# Patient Record
Sex: Female | Born: 1995 | Hispanic: No | Marital: Single | Smoking: Former smoker
Health system: Southern US, Community
[De-identification: ages and names within clinical notes are randomized; demographics above are authoritative.]

## PROBLEM LIST (undated history)

## (undated) ENCOUNTER — Inpatient Hospital Stay (HOSPITAL_COMMUNITY): Payer: Self-pay

## (undated) DIAGNOSIS — F329 Major depressive disorder, single episode, unspecified: Secondary | ICD-10-CM

## (undated) DIAGNOSIS — R51 Headache: Secondary | ICD-10-CM

## (undated) DIAGNOSIS — F32A Depression, unspecified: Secondary | ICD-10-CM

## (undated) DIAGNOSIS — F419 Anxiety disorder, unspecified: Secondary | ICD-10-CM

## (undated) HISTORY — PX: NO PAST SURGERIES: SHX2092

## (undated) HISTORY — DX: Headache: R51

---
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
## 2017-11-13 ENCOUNTER — Other Ambulatory Visit: Payer: Self-pay

## 2017-11-13 ENCOUNTER — Inpatient Hospital Stay (HOSPITAL_COMMUNITY)
Admission: AD | Admit: 2017-11-13 | Discharge: 2017-11-13 | Disposition: A | Discharge status: Home / Self Care | Payer: Self-pay | Source: Ambulatory Visit | Attending: Obstetrics and Gynecology | Admitting: Obstetrics and Gynecology

## 2017-11-13 ENCOUNTER — Encounter (HOSPITAL_COMMUNITY): Payer: Self-pay | Admitting: *Deleted

## 2017-11-13 DIAGNOSIS — N764 Abscess of vulva: Secondary | ICD-10-CM

## 2017-11-13 LAB — URINALYSIS, ROUTINE W REFLEX MICROSCOPIC
BILIRUBIN URINE: NEGATIVE
Glucose, UA: NEGATIVE mg/dL
Hgb urine dipstick: NEGATIVE
KETONES UR: NEGATIVE mg/dL
LEUKOCYTES UA: NEGATIVE
NITRITE: NEGATIVE
PROTEIN: NEGATIVE mg/dL
Specific Gravity, Urine: 1.017 (ref 1.005–1.030)
pH: 7 (ref 5.0–8.0)

## 2017-11-13 LAB — POCT PREGNANCY, URINE: Preg Test, Ur: NEGATIVE

## 2017-11-13 MED ORDER — IBUPROFEN 600 MG PO TABS: 600.0000 mg | ORAL_TABLET | Freq: Four times a day (QID) | ORAL | 0 refills | Status: DC | PRN

## 2017-11-13 MED ORDER — AMOXICILLIN-POT CLAVULANATE 875-125 MG PO TABS: 1.0000 | ORAL_TABLET | Freq: Two times a day (BID) | ORAL | 0 refills | Status: DC

## 2017-11-13 NOTE — MAU Provider Note (Signed)
History     CSN: [id number]  Arrival date and time: 11/13/17 1807   First Provider Initiated Contact with Patient 11/13/17 1842      Chief Complaint  Patient presents with  . Possible Boil   HPI Haley Potter 21 y.o.  Comes to MAU with boil in the genital area.  A week ago it was pea sized and nontender.  Several days ago she soaked it and it became much larger.  [facility] and they said to continue to soak it.  This morning she awakened and it was much larger again and very tender.  Has not squeezed the area.  OB History    No data available      Past Medical History:  Diagnosis Date  . Headache(784.0)     History reviewed. No pertinent surgical history.  Family History  Problem Relation Age of Onset  . Heart attack Maternal Grandfather        Died at the age of 21    Social History   Tobacco Use  . Smoking status: Never Smoker  . Smokeless tobacco: Never Used  Substance Use Topics  . Alcohol use: No  . Drug use: Yes    Types: Marijuana    Allergies: No Known Allergies  Medications Prior to Admission  Medication Sig Dispense Refill Last Dose  . ibuprofen (ADVIL,MOTRIN) 200 MG tablet Take 400 mg by mouth every 6 (six) hours as needed for pain.   Past Week at Unknown  . loratadine (CLARITIN) 10 MG tablet Take 10 mg by mouth as needed for allergies.   two months  . ondansetron (ZOFRAN ODT) 4 MG disintegrating tablet Take 1 tablet (4 mg total) by mouth every 8 (eight) hours as needed for nausea. 8 tablet 0   . topiramate (TOPAMAX) 25 MG tablet Take 2 by mouth each bedtime 60 tablet 0     Review of Systems  Constitutional: Negative for fever.  Gastrointestinal: Negative for abdominal pain.  Genitourinary: Negative for vaginal bleeding and vaginal discharge.       Genital lesion that is painful   Physical Exam   Blood pressure (!) 155/71, pulse (!) 159, temperature 98.5 F (36.9 C), temperature source Oral, height 5'  2" (1.575 m), weight 141 lb 12 oz (64.3 kg), last menstrual period 10/21/2017, SpO2 99 %.  Physical Exam  Nursing note and vitals reviewed. Constitutional: She is oriented to person, place, and time. She appears well-developed and well-nourished.  HENT:  Head: Normocephalic.  Eyes: EOM are normal.  Neck: Neck supple.  Respiratory: Effort normal.  GI: Soft. There is no tenderness. There is no rebound and no guarding.  Genitourinary:  Genitourinary Comments: Genital exam - Swollen left labia - does not include clitoral hood.  Swollen lesion is 1.5 cm x 2cm of cellulitis and then lower is 2-3 cm of edema.  Total of 4-5 cm by 2 cm with a large portion of soft edema.  The cellulitis area is red with no pointing even with slight pressure.  It is firm and not fluctuant.  Musculoskeletal: Normal range of motion.  Neurological: She is alert and oriented to person, place, and time.  Skin: Skin is warm and dry.  Psychiatric: She has a normal mood and affect.    MAU Course  Procedures Results for orders placed or performed during the hospital encounter of 11/13/17 (from the past 24 hour(s))  Urinalysis, Routine w reflex microscopic     Status:  Abnormal   Collection Time: 11/13/17  6:25 PM  Result Value Ref Range   Color, Urine YELLOW YELLOW   APPearance HAZY (A) CLEAR   Specific Gravity, Urine 1.017 1.005 - 1.030   pH 7.0 5.0 - 8.0   Glucose, UA NEGATIVE NEGATIVE mg/dL   Hgb urine dipstick NEGATIVE NEGATIVE   Bilirubin Urine NEGATIVE NEGATIVE   Ketones, ur NEGATIVE NEGATIVE mg/dL   Protein, ur NEGATIVE NEGATIVE mg/dL   Nitrite NEGATIVE NEGATIVE   Leukocytes, UA NEGATIVE NEGATIVE  Pregnancy, urine POC     Status: None   Collection Time: 11/13/17  6:37 PM  Result Value Ref Range   Preg Test, Ur NEGATIVE NEGATIVE    MDM Dr. Alysia PennaErvin in to evaluate the area and discuss plan of care.  Assessment and Plan  Labial cyst, infected  Plan Cyst is not ready for draining at this point GC/Chlam  pending on urine specimen. Antibiotics sent to her pharmacy.  Augmentin 875 mg PO BID x 10 days.  If not tolerated, next option would be keflex 500 mg PO TID x 10 days Continue warm soaks with epsom salts at least 3 times a day. Be seen in the clinic on Monday at 1:40 pm at urgent appointment for reevaluation of cyst.  Sticker put in clinic book in MAU.   [name] 11/13/2017, 7:08 PM   Pt seen and examined. Cyst no amendable for I & D yet. Will treat with antibiotic and continue with warm compresses. F/U in GYN clinic first of next week for reevaluation.  Nettie ElmMichael Emett Stapel, MD

## 2017-11-13 NOTE — Discharge Instructions (Signed)
Cyst is not ready for draining at this point Antibiotics sent to your pharmacy.  Augmentin 875 mg PO BID x 10 days.   Continue warm soaks with epsom salts at least 3 times a day.  Be seen in the clinic on Monday at 1:40 pm at urgent appointment for reevaluation of cyst.

## 2017-11-13 NOTE — MAU Note (Signed)
Pt presents with c/o "cyst" on inner left labia.  Pt reports started off pea size approx 1 week ago, but has progressively gotten bigger and causing more discomfort.  Pt states she's applied heat & ice & taken warm baths, no relief noted.  Reports was seen yesterday @ [facility] and was instructed to be seen in MAU.

## 2017-11-16 ENCOUNTER — Inpatient Hospital Stay (HOSPITAL_COMMUNITY)
Admission: AD | Admit: 2017-11-16 | Discharge: 2017-11-16 | Disposition: A | Discharge status: Home / Self Care | Payer: Self-pay | Source: Ambulatory Visit | Attending: Obstetrics & Gynecology | Admitting: Obstetrics & Gynecology

## 2017-11-16 ENCOUNTER — Ambulatory Visit: Payer: Self-pay | Admitting: Obstetrics and Gynecology

## 2017-11-16 ENCOUNTER — Encounter (HOSPITAL_COMMUNITY): Payer: Self-pay | Admitting: *Deleted

## 2017-11-16 DIAGNOSIS — N907 Vulvar cyst: Secondary | ICD-10-CM

## 2017-11-16 DIAGNOSIS — R102 Pelvic and perineal pain: Secondary | ICD-10-CM | POA: Insufficient documentation

## 2017-11-16 HISTORY — DX: Major depressive disorder, single episode, unspecified: F32.9

## 2017-11-16 HISTORY — DX: Depression, unspecified: F32.A

## 2017-11-16 HISTORY — DX: Anxiety disorder, unspecified: F41.9

## 2017-11-16 MED ORDER — LIDOCAINE HCL 2 % EX GEL
1.0000 "application " | Freq: Once | CUTANEOUS | Status: AC
  Administered 2017-11-16: 05:00:00 via TOPICAL
  Filled 2017-11-16: qty 5

## 2017-11-16 MED ORDER — OXYCODONE-ACETAMINOPHEN 5-325 MG PO TABS
2.0000 | ORAL_TABLET | Freq: Once | ORAL | Status: AC
  Administered 2017-11-16: 2 via ORAL
  Filled 2017-11-16: qty 2

## 2017-11-16 MED ORDER — SULFAMETHOXAZOLE-TRIMETHOPRIM 800-160 MG PO TABS: 1.0000 | ORAL_TABLET | Freq: Two times a day (BID) | ORAL | 0 refills | Status: AC

## 2017-11-16 NOTE — MAU Provider Note (Signed)
Chief Complaint: Cyst   First Provider Initiated Contact with Patient 11/16/17 0331      SUBJECTIVE HPI: Haley Potter is a 21 y.o. G0P0000 who presents to maternity admissions reporting labial cyst is larger, more painful, and is turning black. The cyst started 1 week ago, and she was seen in MAU on 11/13/17. The cyst was too small to drain so she was prescribed abx, which she started yesterday.  Today the cyst became more painful, and she noticed it was much larger, making her whole left labia look swollen. There is also a black dot on top of the cyst which is new today.  She has taken ibuprofen and tried warm compresses but the pain is increasing. She denies any associated symptoms. She denies vaginal bleeding, vaginal itching/burning, urinary symptoms, h/a, dizziness, n/v, or fever/chills.     HPI  Past Medical History:  Diagnosis Date  . Anxiety   . Depression   . ZOXWRUEA(540.9)    Past Surgical History:  Procedure Laterality Date  . NO PAST SURGERIES     Social History   Socioeconomic History  . Marital status: Single    Spouse name: Not on file  . Number of children: Not on file  . Years of education: Not on file  . Highest education level: Not on file  Social Needs  . Financial resource strain: Not on file  . Food insecurity - worry: Not on file  . Food insecurity - inability: Not on file  . Transportation needs - medical: Not on file  . Transportation needs - non-medical: Not on file  Occupational History  . Not on file  Tobacco Use  . Smoking status: Never Smoker  . Smokeless tobacco: Never Used  Substance and Sexual Activity  . Alcohol use: No  . Drug use: Yes    Types: Marijuana    Comment: last use 13 Nov 2017  . Sexual activity: Yes    Birth control/protection: Condom  Other Topics Concern  . Not on file  Social History Narrative  . Not on file   No current facility-administered medications on file prior to encounter.    Current Outpatient  Medications on File Prior to Encounter  Medication Sig Dispense Refill  . amoxicillin-clavulanate (AUGMENTIN) 875-125 MG tablet Take 1 tablet by mouth 2 (two) times daily for 10 days. 20 tablet 0  . naproxen sodium (ALEVE) 220 MG tablet Take 220 mg by mouth.    Marland Kitchen ibuprofen (ADVIL,MOTRIN) 600 MG tablet Take 1 tablet (600 mg total) by mouth every 6 (six) hours as needed. Take with food. 30 tablet 0  . loratadine (CLARITIN) 10 MG tablet Take 10 mg by mouth as needed for allergies.     No Known Allergies  ROS:  Review of Systems  Constitutional: Negative for chills, fatigue and fever.  Respiratory: Negative for shortness of breath.   Cardiovascular: Negative for chest pain.  Gastrointestinal: Negative for nausea and vomiting.  Genitourinary: Positive for vaginal pain. Negative for difficulty urinating, dysuria, flank pain, pelvic pain, vaginal bleeding and vaginal discharge.  Neurological: Negative for dizziness and headaches.  Psychiatric/Behavioral: Negative.      I have reviewed patient's Past Medical Hx, Surgical Hx, Family Hx, Social Hx, medications and allergies.   Physical Exam   Patient Vitals for the past 24 hrs:  BP Temp Temp src Pulse Resp Height Weight  11/16/17 0509 128/71 - - 89 18 - -  11/16/17 0334 135/82 - - (!) 114 - - -  11/16/17 0327 130/90 98.5 F (36.9 C) Oral (!) 130 18 5\' 2"  (1.575 m) 141 lb (64 kg)   Constitutional: Well-developed, well-nourished female in no acute distress.  Cardiovascular: normal rate Respiratory: normal effort GI: Abd soft, non-tender. Pos BS x 4 MS: Extremities nontender, no edema, normal ROM Neurologic: Alert and oriented x 4.  GU: Neg CVAT.  PELVIC EXAM: On visual inspection, left labia with 3x3cm soft, fluctuant cyst, with significant tenderness to palpation, with 1 x 1 cm patch of purplish-black tissue in the middle of the enlarged area. There is also leaking from the outside portion of the cyst of thin purulent drainage.  After  topical lidocaine was applied by RN, pt reported increased thick drainage from cyst.  Thick, purulent drainage from upper/outer part of left labia.  Labia Cyst Drainage Enlarged abscess palpated in left labia. Written informed consent was obtained.  Discussed complications and possible outcomes of procedure including recurrence of cyst, scarring leading to infecton, bleeding, dyspareunia, distortion of anatomy.  Patient was examined in the dorsal lithotomy position and mass was identified.  The area was prepped with Iodine and draped in a sterile manner. 1% Lidocaine (3 ml) was then used to infiltrate area on top of the cyst.  A 7 mm incision was made using a sterile scapel. Upon palpation of the mass, a moderate amount of bloody drainage was expressed through the incision. A hemostat was used to break up loculations, which resulted in expression of mostly bloody drainage.  More purulent drainage expressed from left labial area that is self draining. Patient tolerated the procedure well, reported feeling " a lot better." - Bactrim DS bid x 7 days for treatment - Recommended Sitz baths bid and Motrin prn pain.   She was told to call to be examined if she experiences increasing swelling, pain, vaginal discharge, or fever.  - She was instructed to wear a peripad to absorb discharge, and to maintain pelvic rest until area is healed.  - F/u appointment in 2 weeks from now.   LAB RESULTS No results found for this or any previous visit (from the past 24 hour(s)).     IMAGING No results found.  MAU Management/MDM: Some self-drainage of cyst plus I&D to aid drainage.  Consult Dr Macon LargeAnyanwu who came to bedside after I&D to evaluated discoloration/black spot. Likely varicosity and does not need treatment.  Pt reports significant improvement in pain after I&D.  Change abx to Bactrim DS BID x 7 days, sent to pt pharmacy.  [facility] in 2-3 weeks.  Return to MAU as needed for worsening symptoms.   Pt  discharged with strict infection precautions.  ASSESSMENT 1. Vulvar cyst     PLAN Discharge home Allergies as of 11/16/2017   No Known Allergies     Medication List    STOP taking these medications   amoxicillin-clavulanate 875-125 MG tablet Commonly known as:  AUGMENTIN   ibuprofen 600 MG tablet Commonly known as:  ADVIL,MOTRIN     TAKE these medications   loratadine 10 MG tablet Commonly known as:  CLARITIN Take 10 mg by mouth as needed for allergies.   naproxen sodium 220 MG tablet Commonly known as:  ALEVE Take 220 mg by mouth.   sulfamethoxazole-trimethoprim 800-160 MG tablet Commonly known as:  BACTRIM DS,SEPTRA DS Take 1 tablet by mouth 2 (two) times daily for 7 days.      Follow-up Information    [facility]  Specialty:  Obstetrics and Gynecology Why:  The office will call you with follow up appointment. Return to MAU as needed for worsening symptoms or signs of infection. Contact information: [address] [place] [id number] [phone]          Sharen CounterLisa Leftwich-Kirby Certified Nurse-Midwife 11/16/2017  5:26 AM

## 2017-11-16 NOTE — MAU Note (Signed)
Pt c/o labial cyst for 1 1/2 wks that is getting bigger and turning black. Took naprosyn and just started amoxicillin on Dec 2nd.  Afebrile.

## 2018-05-03 LAB — OB RESULTS CONSOLE GC/CHLAMYDIA
CHLAMYDIA, DNA PROBE: NEGATIVE
Chlamydia: NEGATIVE
GC PROBE AMP, GENITAL: NEGATIVE
Gonorrhea: NEGATIVE

## 2018-05-03 LAB — OB RESULTS CONSOLE HIV ANTIBODY (ROUTINE TESTING)
HIV: NONREACTIVE
HIV: NONREACTIVE

## 2018-05-03 LAB — OB RESULTS CONSOLE RUBELLA ANTIBODY, IGM
RUBELLA: IMMUNE
Rubella: IMMUNE

## 2018-05-03 LAB — OB RESULTS CONSOLE ANTIBODY SCREEN
Antibody Screen: NEGATIVE
Antibody Screen: NEGATIVE

## 2018-05-03 LAB — OB RESULTS CONSOLE ABO/RH
RH Type: POSITIVE
RH Type: POSITIVE

## 2018-05-03 LAB — OB RESULTS CONSOLE HEPATITIS B SURFACE ANTIGEN
HEP B S AG: NEGATIVE
Hepatitis B Surface Ag: NEGATIVE

## 2018-05-03 LAB — OB RESULTS CONSOLE RPR
RPR: NONREACTIVE
RPR: NONREACTIVE

## 2018-05-12 ENCOUNTER — Encounter: Payer: Self-pay | Admitting: Obstetrics

## 2018-05-20 ENCOUNTER — Telehealth: Payer: Self-pay | Admitting: *Deleted

## 2018-05-20 NOTE — Telephone Encounter (Signed)
Left vmail for patient to callback and reschedule missed New Ob appt on 05/12/2018 or let us know her plans.Marland Kitchen.Marland Kitchen..Marland Kitchen

## 2018-06-28 DIAGNOSIS — Z3403 Encounter for supervision of normal first pregnancy, third trimester: Secondary | ICD-10-CM | POA: Diagnosis not present

## 2018-06-28 LAB — OB RESULTS CONSOLE GBS: GBS: NEGATIVE

## 2018-07-02 ENCOUNTER — Encounter (HOSPITAL_COMMUNITY): Payer: Self-pay | Admitting: *Deleted

## 2018-07-02 ENCOUNTER — Inpatient Hospital Stay (HOSPITAL_BASED_OUTPATIENT_CLINIC_OR_DEPARTMENT_OTHER): Payer: Medicaid Other

## 2018-07-02 ENCOUNTER — Inpatient Hospital Stay (HOSPITAL_COMMUNITY)
Admission: AD | Admit: 2018-07-02 | Discharge: 2018-07-02 | Disposition: A | Discharge status: Home / Self Care | Payer: Medicaid Other | Source: Ambulatory Visit | Attending: Obstetrics & Gynecology | Admitting: Obstetrics & Gynecology

## 2018-07-02 DIAGNOSIS — O289 Unspecified abnormal findings on antenatal screening of mother: Secondary | ICD-10-CM

## 2018-07-02 DIAGNOSIS — O368130 Decreased fetal movements, third trimester, not applicable or unspecified: Secondary | ICD-10-CM

## 2018-07-02 DIAGNOSIS — Z3A36 36 weeks gestation of pregnancy: Secondary | ICD-10-CM

## 2018-07-02 DIAGNOSIS — R102 Pelvic and perineal pain: Secondary | ICD-10-CM | POA: Insufficient documentation

## 2018-07-02 DIAGNOSIS — O288 Other abnormal findings on antenatal screening of mother: Secondary | ICD-10-CM

## 2018-07-02 DIAGNOSIS — M549 Dorsalgia, unspecified: Secondary | ICD-10-CM | POA: Diagnosis not present

## 2018-07-02 DIAGNOSIS — Z87891 Personal history of nicotine dependence: Secondary | ICD-10-CM | POA: Insufficient documentation

## 2018-07-02 DIAGNOSIS — N898 Other specified noninflammatory disorders of vagina: Secondary | ICD-10-CM | POA: Insufficient documentation

## 2018-07-02 DIAGNOSIS — R879 Unspecified abnormal finding in specimens from female genital organs: Secondary | ICD-10-CM | POA: Diagnosis present

## 2018-07-02 DIAGNOSIS — O26893 Other specified pregnancy related conditions, third trimester: Secondary | ICD-10-CM | POA: Insufficient documentation

## 2018-07-02 LAB — AMNISURE RUPTURE OF MEMBRANE (ROM) NOT AT ARMC: Amnisure ROM: NEGATIVE

## 2018-07-02 NOTE — MAU Note (Signed)
Pt C/O back pain since Monday, became severe last night, radiates throughout her body, arms & legs tingle & go numb.  Drastic increase in vaginal discharge, ? Leaking fluid.  Feeling unterus tighten.  Denies bleeding.  Fetal movement decreased.

## 2018-07-02 NOTE — MAU Note (Signed)
Urine in lab 

## 2018-07-02 NOTE — MAU Provider Note (Signed)
History     CSN: [id number]  Arrival date and time: 07/02/18 4098   First Provider Initiated Contact with Patient 07/02/18 [phone]      Chief Complaint  Patient presents with  . Back Pain  . Rupture of Membranes  . Decreased Fetal Movement   HPI  Ms.  Haley Potter is a 22 y.o. year old G16P0000 female at [redacted]w[redacted]d weeks gestation who presents to MAU reporting back pain since Monday 06/28/18 - became severe last night, radiating throughout her body, arms and legs, numbness in legs and arms, "drastic" increase in vaginal discharge, leaking fluid, abdominal pain/"tightening", and DFM. She denies VB. She receives [facility]   Past Medical History:  Diagnosis Date  . Anxiety   . Depression   . YNWGNFAO(130.8)     Past Surgical History:  Procedure Laterality Date  . NO PAST SURGERIES      Family History  Problem Relation Age of Onset  . Heart attack Maternal Grandfather        Died at the age of 23    Social History   Tobacco Use  . Smoking status: Former Games developer  . Smokeless tobacco: Never Used  Substance Use Topics  . Alcohol use: No  . Drug use: Not Currently    Types: Marijuana    Comment: last use 13 Nov 2017    Allergies: No Known Allergies  Medications Prior to Admission  Medication Sig Dispense Refill Last Dose  . acetaminophen (TYLENOL) 500 MG tablet Take 500 mg by mouth every 6 (six) hours as needed for mild pain or headache.   07/01/2018 at Unknown time  . Prenatal Vit-Fe Fumarate-FA (PRENATAL MULTIVITAMIN) TABS tablet Take 1 tablet by mouth daily at 12 noon.   07/01/2018 at Unknown time    Review of Systems  Constitutional: Negative.   HENT: Negative.   Eyes: Negative.   Respiratory: Negative.   Gastrointestinal: Positive for abdominal pain.  Endocrine: Negative.   Genitourinary: Positive for pelvic pain and vaginal discharge ("drastic increase"; also ?leaking fluid since last night).  Musculoskeletal: Positive for back pain.  Skin: Negative.    Allergic/Immunologic: Negative.   Neurological: Positive for weakness (in legs and arms).  Hematological: Negative.   Psychiatric/Behavioral: Negative.    Physical Exam   Blood pressure 111/65, pulse (!) 121, temperature 98.8 F (37.1 C), temperature source Oral, resp. rate 16, height 5' 2.99" (1.6 m), weight 79.6 kg (175 lb 6.4 oz), last menstrual period 10/21/2017, SpO2 99 %.  Physical Exam  Nursing note and vitals reviewed. Constitutional: She is oriented to person, place, and time. She appears well-developed and well-nourished.  HENT:  Head: Normocephalic and atraumatic.  Eyes: Pupils are equal, round, and reactive to light.  Neck: Normal range of motion.  Cardiovascular: Normal rate, regular rhythm, normal heart sounds and intact distal pulses.  Respiratory: Effort normal and breath sounds normal.  GI: Soft. Bowel sounds are normal.  Genitourinary:  Genitourinary Comments: Pelvic deferred  Musculoskeletal: Normal range of motion.  Neurological: She is alert and oriented to person, place, and time. She has normal reflexes.  Skin: Skin is warm and dry.  Psychiatric: She has a normal mood and affect. Her behavior is normal. Judgment and thought content normal.    MAU Course  Procedures  MDM Fern Slide Amnisure NST - FHR: 150 bpm / minimal variability / 10x10 accels present / variable decels present / TOCO: regular every 2-4 mins   *Consult with Dr. Debroah Loop @ 1300 - notified  of patient's complaints, assessments, lab & U/S results, tx plan d/c home - ok to d/c home, agrees with plan; Results for orders placed or performed during the hospital encounter of 07/02/18 (from the past 24 hour(s))  Amnisure rupture of membrane (rom)not at [facility]     Status: None   Collection Time: 07/02/18  9:54 AM  Result Value Ref Range   Amnisure ROM NEGATIVE     [place] Mfm Fetal Bpp Wo Non Stress  Result Date: 07/02/2018 ----------------------------------------------------------------------   OBSTETRICS REPORT                      (Signed Final 07/02/2018 11:45 am) ---------------------------------------------------------------------- Patient Info  ID #:       [id number]                          D.O.B.:  Jan 04, 1996 (22 yrs)  Name:       Haley Potter                Visit Date: 07/02/2018 11:07 am ---------------------------------------------------------------------- Performed By  Performed By:     Eden Lathe BS      Ref. Address:     [address]                    [facility]                                                             [address]                                                             [place], [place]                                                             [address]  Attending:        Blase Mess MD       Location[facility]         [facility]  Referred By:      Haley Mora CNM ---------------------------------------------------------------------- Orders   #  Description                                 Code   1  [place] MFM FETAL BPP WO NON STRESS              [phone]  ----------------------------------------------------------------------   #  Ordered By               Order #        Accession #    Episode #   1  Haley Mora           [id number]  [id number]     [id number]  ---------------------------------------------------------------------- Indications   [redacted] weeks gestation of pregnancy                Z3A.36   Non-reactive NST                               O28.9  ---------------------------------------------------------------------- OB History  Gravidity:    1         Term:   0        Prem:   0        SAB:   0  TOP:          0       Ectopic:  0        Living: 0 ---------------------------------------------------------------------- Fetal Evaluation  Num Of Fetuses:     1  Fetal Heart         153  Rate(bpm):  Cardiac Activity:   Observed  Presentation:       Cephalic  Amniotic Fluid  AFI FV:      Subjectively within normal limits  AFI Sum(cm)     %Tile       Largest  Pocket(cm)  15.47           57          6.09  RUQ(cm)       RLQ(cm)       LUQ(cm)        LLQ(cm)  4.14          1.57          6.09           3.67 ---------------------------------------------------------------------- Biophysical Evaluation  Amniotic F.V:   Within normal limits       F. Tone:        Observed  F. Movement:    Observed                   Score:          8/8  F. Breathing:   Observed ---------------------------------------------------------------------- Gestational Age  LMP:           36w 2d        Date:  10/21/17                 EDD:   07/28/18  Best:          Stevie Kern36w 2d     Det. By:  LMP  (10/21/17)          EDD:   07/28/18 ---------------------------------------------------------------------- Recommendations  Follow up as clinically indicated and or scheduled. ----------------------------------------------------------------------                 Blase MessHenry Adekola, MD Electronically Signed Final Report   07/02/2018 11:45 am ----------------------------------------------------------------------   Assessment and Plan  Abnormal vaginal fluids - Plan: Discharge patient - Reassurance that Amnisure was negative; meaning that her water is not broken  Non-reactive NST (non-stress test) - Reassurance given that U/S & NST (after U/S) confirmed good fetal well-being - Advised that NST eventually became reactive   Haley Moraolitta Malaia Buchta, MSN, CNM 07/02/2018, 9:50 AM

## 2018-07-02 NOTE — Discharge Instructions (Signed)
You can take Tylenol 1000 mg every 6 hours as needed for back pain

## 2018-08-02 ENCOUNTER — Telehealth (HOSPITAL_COMMUNITY): Payer: Self-pay | Admitting: *Deleted

## 2018-08-02 DIAGNOSIS — Z3403 Encounter for supervision of normal first pregnancy, third trimester: Secondary | ICD-10-CM | POA: Diagnosis not present

## 2018-08-02 DIAGNOSIS — O365930 Maternal care for other known or suspected poor fetal growth, third trimester, not applicable or unspecified: Secondary | ICD-10-CM | POA: Diagnosis not present

## 2018-08-02 DIAGNOSIS — O480 Post-term pregnancy: Secondary | ICD-10-CM | POA: Diagnosis not present

## 2018-08-02 NOTE — Telephone Encounter (Signed)
Preadmission screen  

## 2018-08-03 ENCOUNTER — Encounter (HOSPITAL_COMMUNITY): Payer: Self-pay | Admitting: *Deleted

## 2018-08-03 ENCOUNTER — Telehealth (HOSPITAL_COMMUNITY): Payer: Self-pay | Admitting: *Deleted

## 2018-08-03 NOTE — Telephone Encounter (Signed)
Preadmission screen  

## 2018-08-04 ENCOUNTER — Encounter (HOSPITAL_COMMUNITY): Payer: Self-pay

## 2018-08-04 ENCOUNTER — Other Ambulatory Visit: Payer: Self-pay

## 2018-08-04 ENCOUNTER — Inpatient Hospital Stay (HOSPITAL_COMMUNITY): Payer: Medicaid Other | Admitting: Anesthesiology

## 2018-08-04 ENCOUNTER — Inpatient Hospital Stay (HOSPITAL_COMMUNITY)
Admission: RE | Admit: 2018-08-04 | Discharge: 2018-08-08 | DRG: 786 | Disposition: A | Discharge status: Home / Self Care | Payer: Medicaid Other | Attending: Obstetrics and Gynecology | Admitting: Obstetrics and Gynecology

## 2018-08-04 VITALS — BP 119/74 | HR 71 | Temp 98.8°F | Resp 18 | Ht 62.0 in | Wt 190.2 lb

## 2018-08-04 DIAGNOSIS — D649 Anemia, unspecified: Secondary | ICD-10-CM | POA: Diagnosis present

## 2018-08-04 DIAGNOSIS — Z3A41 41 weeks gestation of pregnancy: Secondary | ICD-10-CM | POA: Diagnosis not present

## 2018-08-04 DIAGNOSIS — R879 Unspecified abnormal finding in specimens from female genital organs: Secondary | ICD-10-CM

## 2018-08-04 DIAGNOSIS — O480 Post-term pregnancy: Secondary | ICD-10-CM | POA: Diagnosis not present

## 2018-08-04 DIAGNOSIS — O9902 Anemia complicating childbirth: Secondary | ICD-10-CM | POA: Diagnosis not present

## 2018-08-04 DIAGNOSIS — Z98891 History of uterine scar from previous surgery: Secondary | ICD-10-CM

## 2018-08-04 DIAGNOSIS — O8612 Endometritis following delivery: Secondary | ICD-10-CM | POA: Diagnosis not present

## 2018-08-04 DIAGNOSIS — Z87891 Personal history of nicotine dependence: Secondary | ICD-10-CM

## 2018-08-04 LAB — RAPID URINE DRUG SCREEN, HOSP PERFORMED
AMPHETAMINES: NOT DETECTED
BENZODIAZEPINES: NOT DETECTED
Barbiturates: NOT DETECTED
Cocaine: NOT DETECTED
Opiates: NOT DETECTED
TETRAHYDROCANNABINOL: NOT DETECTED

## 2018-08-04 LAB — CBC
HEMATOCRIT: 36.4 % (ref 36.0–46.0)
Hemoglobin: 12.5 g/dL (ref 12.0–15.0)
MCH: 29 pg (ref 26.0–34.0)
MCHC: 34.3 g/dL (ref 30.0–36.0)
MCV: 84.5 fL (ref 78.0–100.0)
PLATELETS: 175 10*3/uL (ref 150–400)
RBC: 4.31 MIL/uL (ref 3.87–5.11)
RDW: 14.8 % (ref 11.5–15.5)
WBC: 11.9 10*3/uL — ABNORMAL HIGH (ref 4.0–10.5)

## 2018-08-04 LAB — TYPE AND SCREEN
ABO/RH(D): B POS
Antibody Screen: NEGATIVE

## 2018-08-04 LAB — ABO/RH: ABO/RH(D): B POS

## 2018-08-04 MED ORDER — LIDOCAINE HCL (PF) 1 % IJ SOLN: 30.0000 mL | INTRAMUSCULAR | Status: DC | PRN

## 2018-08-04 MED ORDER — PHENYLEPHRINE 40 MCG/ML (10ML) SYRINGE FOR IV PUSH (FOR BLOOD PRESSURE SUPPORT)
80.0000 ug | PREFILLED_SYRINGE | INTRAVENOUS | Status: DC | PRN
  Filled 2018-08-04: qty 10

## 2018-08-04 MED ORDER — ACETAMINOPHEN 325 MG PO TABS: 650.0000 mg | ORAL_TABLET | ORAL | Status: DC | PRN

## 2018-08-04 MED ORDER — FENTANYL 2.5 MCG/ML BUPIVACAINE 1/10 % EPIDURAL INFUSION (WH - ANES)
14.0000 mL/h | INTRAMUSCULAR | Status: DC | PRN
  Administered 2018-08-04 – 2018-08-05 (×2): 14 mL/h via EPIDURAL
  Filled 2018-08-04 (×2): qty 100

## 2018-08-04 MED ORDER — LACTATED RINGERS IV SOLN
INTRAVENOUS | Status: DC
  Administered 2018-08-04 – 2018-08-05 (×3): via INTRAVENOUS

## 2018-08-04 MED ORDER — MISOPROSTOL 25 MCG QUARTER TABLET
25.0000 ug | ORAL_TABLET | ORAL | Status: DC | PRN
  Administered 2018-08-04 (×2): 25 ug via VAGINAL
  Filled 2018-08-04 (×3): qty 1

## 2018-08-04 MED ORDER — OXYTOCIN 40 UNITS IN LACTATED RINGERS INFUSION - SIMPLE MED: 2.5000 [IU]/h | INTRAVENOUS | Status: DC

## 2018-08-04 MED ORDER — OXYCODONE-ACETAMINOPHEN 5-325 MG PO TABS: 2.0000 | ORAL_TABLET | ORAL | Status: DC | PRN

## 2018-08-04 MED ORDER — EPHEDRINE 5 MG/ML INJ: 10.0000 mg | INTRAVENOUS | Status: DC | PRN

## 2018-08-04 MED ORDER — LIDOCAINE HCL (PF) 1 % IJ SOLN
INTRAMUSCULAR | Status: DC | PRN
  Administered 2018-08-04 (×2): 6 mL via EPIDURAL

## 2018-08-04 MED ORDER — PHENYLEPHRINE 40 MCG/ML (10ML) SYRINGE FOR IV PUSH (FOR BLOOD PRESSURE SUPPORT): 80.0000 ug | PREFILLED_SYRINGE | INTRAVENOUS | Status: DC | PRN

## 2018-08-04 MED ORDER — OXYTOCIN BOLUS FROM INFUSION: 500.0000 mL | Freq: Once | INTRAVENOUS | Status: DC

## 2018-08-04 MED ORDER — ONDANSETRON HCL 4 MG/2ML IJ SOLN: 4.0000 mg | Freq: Four times a day (QID) | INTRAMUSCULAR | Status: DC | PRN

## 2018-08-04 MED ORDER — ZOLPIDEM TARTRATE 5 MG PO TABS
5.0000 mg | ORAL_TABLET | Freq: Every evening | ORAL | Status: DC | PRN
  Administered 2018-08-05: 5 mg via ORAL
  Filled 2018-08-04: qty 1

## 2018-08-04 MED ORDER — FENTANYL CITRATE (PF) 100 MCG/2ML IJ SOLN
100.0000 ug | INTRAMUSCULAR | Status: DC | PRN
  Administered 2018-08-04 (×5): 100 ug via INTRAVENOUS
  Filled 2018-08-04 (×5): qty 2

## 2018-08-04 MED ORDER — LACTATED RINGERS IV SOLN
500.0000 mL | Freq: Once | INTRAVENOUS | Status: AC
  Administered 2018-08-04: 500 mL via INTRAVENOUS

## 2018-08-04 MED ORDER — LACTATED RINGERS IV SOLN
500.0000 mL | INTRAVENOUS | Status: DC | PRN
  Administered 2018-08-05 (×3): 500 mL via INTRAVENOUS

## 2018-08-04 MED ORDER — OXYCODONE-ACETAMINOPHEN 5-325 MG PO TABS: 1.0000 | ORAL_TABLET | ORAL | Status: DC | PRN

## 2018-08-04 MED ORDER — DIPHENHYDRAMINE HCL 50 MG/ML IJ SOLN: 12.5000 mg | INTRAMUSCULAR | Status: DC | PRN

## 2018-08-04 MED ORDER — SOD CITRATE-CITRIC ACID 500-334 MG/5ML PO SOLN
30.0000 mL | ORAL | Status: DC | PRN
  Administered 2018-08-05: 30 mL via ORAL
  Filled 2018-08-04: qty 15

## 2018-08-04 NOTE — Anesthesia Pain Management Evaluation Note (Signed)
  [facility] Pain Management Visit Note  Patient: Haley Potter, 22 y.o., female  "Hello I am a member of the anesthesia team at [facility]. We have an anesthesia team available at all times to provide care throughout the hospital, including epidural management and anesthesia for C-section. I don't know your plan for the delivery whether it a natural birth, water birth, IV sedation, nitrous supplementation, doula or epidural, but we want to meet your pain goals."   1.Was your pain managed to your expectations on prior hospitalizations?   No prior hospitalizations  2.What is your expectation for pain management during this hospitalization?     Epidural  3.How can we help you reach that goal? unsure  Record the patient's initial score and the patient's pain goal.   Pain: 0  Pain Goal: 7 [facility] wants you to be able to say your pain was always managed very well.  Cephus ShellingBURGER,Leiya Keesey 08/04/2018

## 2018-08-04 NOTE — Progress Notes (Signed)
LABOR PROGRESS NOTE  Haley Potter is a 22 y.o. G1P0000 at 2320w0d  admitted for IOL for postdates   Subjective: Patient doing well, starting to feel some cramping and contractions   Objective: BP 109/68   Pulse 85   Temp 98.7 F (37.1 C) (Oral)   Resp 20   Ht 5\' 2"  (1.575 m)   Wt 86.3 kg   LMP 10/21/2017 (Exact Date)   BMI 34.79 kg/m  or  Vitals:   08/04/18 1400 08/04/18 1500 08/04/18 1608 08/04/18 1715  BP:   120/76 109/68  Pulse:   77 85  Resp: 20 18 20    Temp:   98.1 F (36.7 C) 98.7 F (37.1 C)  TempSrc:   Oral Oral  Weight:      Height:        FB placed @1600  Dilation: 1.5 Effacement (%): 50 Cervical Position: Posterior Station: -2 Presentation: Vertex Exam by:: Lanice ShirtsV. Jin Capote, CNM FHT: baseline rate 135, moderate varibility, +acel, no decel Toco: 2 minutes   Labs: Lab Results  Component Value Date   WBC 11.9 (H) 08/04/2018   HGB 12.5 08/04/2018   HCT 36.4 08/04/2018   MCV 84.5 08/04/2018   PLT 175 08/04/2018    Patient Active Problem List   Diagnosis Date Noted  . Indication for care in labor and delivery, antepartum 08/04/2018  . Abnormal vaginal fluids 07/02/2018    Assessment / Plan: 22 y.o. G1P0000 at 7620w0d here for IOL for postdates   Labor: FB placed, second cytotec placed Fetal Wellbeing:  Cat I  Pain Control:  Pain medication ordered PRN  Anticipated MOD:  SVD  Sharyon CableRogers, Tonae Livolsi C, CNM 08/04/2018, 5:39 PM

## 2018-08-04 NOTE — H&P (Signed)
OBSTETRIC ADMISSION HISTORY AND PHYSICAL  Haley Potter is a 22 y.o. female G1P0000 with IUP at 555w0d by LMP presenting for postdates induction. She reports +FMs, No LOF, no VB, no blurry vision, headaches or peripheral edema, and RUQ pain.  She plans on breastfeeding. She request IUD for birth control. She received her [facility] care at [facility]   Dating: By LMP --->  Estimated Date of Delivery: 07/28/18  Sono:    @[redacted]w[redacted]d , CWD, normal anatomy, cephalic presentation, vertex lie, 1048g, 21.6% EFW   Prenatal History/Complications:  Past Medical History: Past Medical History:  Diagnosis Date  . Anxiety   . Depression   . ZOXWRUEA(540.9Headache(784.0)     Past Surgical History: Past Surgical History:  Procedure Laterality Date  . NO PAST SURGERIES      Obstetrical History: OB History    Gravida  1   Para  0   Term  0   Preterm  0   AB  0   Living  0     SAB  0   TAB  0   Ectopic  0   Multiple  0   Live Births  0           Social History: Social History   Socioeconomic History  . Marital status: Single    Spouse name: Not on file  . Number of children: Not on file  . Years of education: Not on file  . Highest education level: Not on file  Occupational History  . Not on file  Social Needs  . Financial resource strain: Not hard at all  . Food insecurity:    Worry: Never true    Inability: Never true  . Transportation needs:    Medical: No    Non-medical: No  Tobacco Use  . Smoking status: Former Games developermoker  . Smokeless tobacco: Never Used  Substance and Sexual Activity  . Alcohol use: No  . Drug use: Not Currently    Types: Marijuana    Comment: last use 13 Nov 2017  . Sexual activity: Yes    Birth control/protection: Condom  Lifestyle  . Physical activity:    Days per week: 0 days    Minutes per session: 0 min  . Stress: Only a little  Relationships  . Social connections:    Talks on phone: Not on file    Gets together: Not on file    Attends  religious service: Not on file    Active member of club or organization: Not on file    Attends meetings of clubs or organizations: Not on file    Relationship status: Not on file  Other Topics Concern  . Not on file  Social History Narrative  . Not on file    Family History: Family History  Problem Relation Age of Onset  . Fibromyalgia Mother   . Lupus Mother   . Cardiomyopathy Father   . Heart attack Maternal Grandfather        Died at the age of 20    Allergies: No Known Allergies  Medications Prior to Admission  Medication Sig Dispense Refill Last Dose  . acetaminophen (TYLENOL) 500 MG tablet Take 500 mg by mouth every 6 (six) hours as needed for mild pain or headache.   07/01/2018 at Unknown time  . Prenatal Vit-Fe Fumarate-FA (PRENATAL MULTIVITAMIN) TABS tablet Take 1 tablet by mouth daily at 12 noon.   07/01/2018 at Unknown time     Review of Systems  All systems reviewed and negative except as stated in HPI  Blood pressure 125/76, pulse 99, temperature 98.2 F (36.8 C), temperature source Oral, resp. rate 18, height 5\' 2"  (1.575 m), weight 86.3 kg, last menstrual period 10/21/2017. General appearance: alert, cooperative and no distress Lungs: clear to auscultation bilaterally Heart: regular rate  Abdomen: soft, non-tender; bowel sounds normal Extremities: Homans sign is negative, no sign of DVT Presentation: cephalic Fetal monitoringBaseline: 140 bpm, Variability: Good {> 6 bpm), Accelerations: Reactive and Decelerations: ? variable decel  Uterine activity: q3-6 min    Dilation: 1.5 Effacement (%): 50 Cervical Position: Posterior Station: -2 Presentation: Vertex Exam by:: Valentina Lucks. Woods, RN  Prenatal labs: ABO, Rh: B, B/Positive, Positive/-- (05/20 0000) Antibody: Negative, Negative (05/20 0000) Rubella: Immune, Immune (05/20 0000) RPR: Nonreactive, Nonreactive (05/20 0000)  HBsAg: Negative, Negative (05/20 0000)  HIV: Non-reactive, Non-reactive (05/20 0000)   GBS: Negative (07/15 0000)  1 hr Glucola 97 (neg) Genetic screening  Established care too late  Anatomy US normal  Prenatal Transfer Tool  Maternal Diabetes: No Genetic Screening: Declined Maternal Ultrasounds/Referrals: Normal Fetal Ultrasounds or other Referrals:  None Maternal Substance Abuse:  Yes:  Type: Smoker, Marijuana Significant Maternal Medications:  None Significant Maternal Lab Results: Lab values include: Group B Strep negative  Results for orders placed or performed during the hospital encounter of 08/04/18 (from the past 24 hour(s))  CBC   Collection Time: 08/04/18 11:00 AM  Result Value Ref Range   WBC 11.9 (H) 4.0 - 10.5 K/uL   RBC 4.31 3.87 - 5.11 MIL/uL   Hemoglobin 12.5 12.0 - 15.0 g/dL   HCT 62.136.4 30.836.0 - 65.746.0 %   MCV 84.5 78.0 - 100.0 fL   MCH 29.0 26.0 - 34.0 pg   MCHC 34.3 30.0 - 36.0 g/dL   RDW 84.614.8 96.211.5 - 95.215.5 %   Platelets 175 150 - 400 K/uL    Patient Active Problem List   Diagnosis Date Noted  . Indication for care in labor and delivery, antepartum 08/04/2018  . Abnormal vaginal fluids 07/02/2018    Assessment/Plan:  Haley Potter is a 22 y.o. G1P0000 at 6741w0d here for postdates induction.  #Labor: Cytotec x1 started @1200   #Pain: Epidural upon request.  #FWB: Cat I-II  #ID:  GBS neg #MOF: Breast #MOC: IUD (Mirena)  #Circ:  N/A   Marcy Sirenatherine Nizar Cutler, D.O. OB Fellow  08/04/2018, 12:46 PM

## 2018-08-04 NOTE — Anesthesia Preprocedure Evaluation (Signed)
Anesthesia Evaluation  Patient identified by MRN, date of birth, ID band Patient awake    Reviewed: Allergy & Precautions, H&P , NPO status , Patient's Chart, lab work & pertinent test results  Airway Mallampati: I  TM Distance: >3 FB Neck ROM: full    Dental no notable dental hx. (+) Teeth Intact   Pulmonary former smoker,    Pulmonary exam normal breath sounds clear to auscultation       Cardiovascular negative cardio ROS Normal cardiovascular exam Rhythm:regular Rate:Normal     Neuro/Psych    GI/Hepatic negative GI ROS, Neg liver ROS,   Endo/Other  negative endocrine ROS  Renal/GU negative Renal ROS  negative genitourinary   Musculoskeletal negative musculoskeletal ROS (+)   Abdominal (+) + obese,   Peds  Hematology negative hematology ROS (+)   Anesthesia Other Findings   Reproductive/Obstetrics (+) Pregnancy                             Anesthesia Physical Anesthesia Plan  ASA: II  Anesthesia Plan: Epidural   Post-op Pain Management:    Induction:   PONV Risk Score and Plan:   Airway Management Planned:   Additional Equipment:   Intra-op Plan:   Post-operative Plan:   Informed Consent: I have reviewed the patients History and Physical, chart, labs and discussed the procedure including the risks, benefits and alternatives for the proposed anesthesia with the patient or authorized representative who has indicated his/her understanding and acceptance.     Plan Discussed with:   Anesthesia Plan Comments:         Anesthesia Quick Evaluation

## 2018-08-04 NOTE — Anesthesia Procedure Notes (Signed)
Epidural Patient location during procedure: OB Start time: 08/04/2018 11:45 PM End time: 08/04/2018 11:49 PM  Staffing Anesthesiologist: Leilani AbleHatchett, Yandriel Boening, MD Performed: anesthesiologist   Preanesthetic Checklist Completed: patient identified, site marked, surgical consent, pre-op evaluation, timeout performed, IV checked, risks and benefits discussed and monitors and equipment checked  Epidural Patient position: sitting Prep: site prepped and draped and DuraPrep Patient monitoring: continuous pulse ox and blood pressure Approach: midline Location: L3-L4 Injection technique: LOR air  Needle:  Needle type: Tuohy  Needle gauge: 17 G Needle length: 9 cm and 9 Needle insertion depth: 6 cm Catheter type: closed end flexible Catheter size: 19 Gauge Catheter at skin depth: 11 cm Test dose: negative and Other  Assessment Sensory level: T9 Events: blood not aspirated, injection not painful, no injection resistance, negative IV test and no paresthesia  Additional Notes Reason for block:procedure for pain

## 2018-08-05 ENCOUNTER — Encounter (HOSPITAL_COMMUNITY)
Admission: RE | Disposition: A | Discharge status: Home / Self Care | Payer: Self-pay | Source: Home / Self Care | Attending: Obstetrics and Gynecology

## 2018-08-05 ENCOUNTER — Encounter (HOSPITAL_COMMUNITY): Payer: Self-pay

## 2018-08-05 DIAGNOSIS — Z3A41 41 weeks gestation of pregnancy: Secondary | ICD-10-CM

## 2018-08-05 LAB — CREATININE, SERUM
CREATININE: 0.55 mg/dL (ref 0.44–1.00)
GFR calc non Af Amer: >60 mL/min (ref >60)

## 2018-08-05 LAB — CBC
HEMATOCRIT: 32.6 % — AB (ref 36.0–46.0)
HEMOGLOBIN: 11.1 g/dL — AB (ref 12.0–15.0)
MCH: 28.8 pg (ref 26.0–34.0)
MCHC: 34 g/dL (ref 30.0–36.0)
MCV: 84.7 fL (ref 78.0–100.0)
Platelets: 148 10*3/uL — ABNORMAL LOW (ref 150–400)
RBC: 3.85 MIL/uL — AB (ref 3.87–5.11)
RDW: 14.8 % (ref 11.5–15.5)
WBC: 26.2 10*3/uL — AB (ref 4.0–10.5)

## 2018-08-05 LAB — RPR: RPR: NONREACTIVE

## 2018-08-05 SURGERY — Surgical Case
Anesthesia: Epidural | Site: Abdomen | Wound class: Clean Contaminated

## 2018-08-05 MED ORDER — LIDOCAINE-EPINEPHRINE (PF) 2 %-1:200000 IJ SOLN
INTRAMUSCULAR | Status: DC | PRN
  Administered 2018-08-05: 10 mL via EPIDURAL

## 2018-08-05 MED ORDER — OXYTOCIN 10 UNIT/ML IJ SOLN
INTRAVENOUS | Status: DC | PRN
  Administered 2018-08-05: 40 [IU] via INTRAVENOUS

## 2018-08-05 MED ORDER — SIMETHICONE 80 MG PO CHEW
80.0000 mg | CHEWABLE_TABLET | ORAL | Status: DC | PRN
  Administered 2018-08-05: 80 mg via ORAL
  Filled 2018-08-05: qty 1

## 2018-08-05 MED ORDER — LACTATED RINGERS IV SOLN
INTRAVENOUS | Status: DC
  Administered 2018-08-05: 23:00:00 via INTRAVENOUS

## 2018-08-05 MED ORDER — MORPHINE SULFATE (PF) 0.5 MG/ML IJ SOLN
INTRAMUSCULAR | Status: DC | PRN
  Administered 2018-08-05: 3 mg via EPIDURAL

## 2018-08-05 MED ORDER — IBUPROFEN 800 MG PO TABS: 800.0000 mg | ORAL_TABLET | Freq: Three times a day (TID) | ORAL | Status: DC

## 2018-08-05 MED ORDER — ONDANSETRON HCL 4 MG/2ML IJ SOLN
INTRAMUSCULAR | Status: DC | PRN
  Administered 2018-08-05: 4 mg via INTRAVENOUS

## 2018-08-05 MED ORDER — LIDOCAINE-EPINEPHRINE (PF) 2 %-1:200000 IJ SOLN
INTRAMUSCULAR | Status: AC
  Filled 2018-08-05: qty 20

## 2018-08-05 MED ORDER — SIMETHICONE 80 MG PO CHEW
80.0000 mg | CHEWABLE_TABLET | Freq: Three times a day (TID) | ORAL | Status: DC
  Administered 2018-08-06 – 2018-08-08 (×7): 80 mg via ORAL
  Filled 2018-08-05 (×8): qty 1

## 2018-08-05 MED ORDER — MEPERIDINE HCL 25 MG/ML IJ SOLN: 6.2500 mg | INTRAMUSCULAR | Status: DC | PRN

## 2018-08-05 MED ORDER — KETOROLAC TROMETHAMINE 30 MG/ML IJ SOLN
30.0000 mg | Freq: Four times a day (QID) | INTRAMUSCULAR | Status: AC
  Administered 2018-08-05 – 2018-08-06 (×4): 30 mg via INTRAVENOUS
  Filled 2018-08-05 (×4): qty 1

## 2018-08-05 MED ORDER — KETOROLAC TROMETHAMINE 30 MG/ML IJ SOLN
INTRAMUSCULAR | Status: AC
  Filled 2018-08-05: qty 1

## 2018-08-05 MED ORDER — DIBUCAINE 1 % RE OINT: 1.0000 "application " | TOPICAL_OINTMENT | RECTAL | Status: DC | PRN

## 2018-08-05 MED ORDER — SCOPOLAMINE 1 MG/3DAYS TD PT72
MEDICATED_PATCH | TRANSDERMAL | Status: AC
  Filled 2018-08-05: qty 1

## 2018-08-05 MED ORDER — OXYTOCIN 40 UNITS IN LACTATED RINGERS INFUSION - SIMPLE MED
1.0000 m[IU]/min | INTRAVENOUS | Status: DC
  Administered 2018-08-05: 2 m[IU]/min via INTRAVENOUS
  Filled 2018-08-05: qty 1000

## 2018-08-05 MED ORDER — METOCLOPRAMIDE HCL 5 MG/ML IJ SOLN: 10.0000 mg | Freq: Once | INTRAMUSCULAR | Status: DC | PRN

## 2018-08-05 MED ORDER — SCOPOLAMINE 1 MG/3DAYS TD PT72
MEDICATED_PATCH | TRANSDERMAL | Status: DC | PRN
  Administered 2018-08-05: 1 via TRANSDERMAL

## 2018-08-05 MED ORDER — BUPIVACAINE HCL (PF) 0.5 % IJ SOLN
INTRAMUSCULAR | Status: AC
  Filled 2018-08-05: qty 30

## 2018-08-05 MED ORDER — BUPIVACAINE HCL (PF) 0.5 % IJ SOLN
INTRAMUSCULAR | Status: DC | PRN
  Administered 2018-08-05: 30 mL

## 2018-08-05 MED ORDER — CEFAZOLIN SODIUM-DEXTROSE 2-4 GM/100ML-% IV SOLN: 2.0000 g | Freq: Once | INTRAVENOUS | Status: DC

## 2018-08-05 MED ORDER — SODIUM BICARBONATE 8.4 % IV SOLN
INTRAVENOUS | Status: DC | PRN
  Administered 2018-08-05: 5 mL via EPIDURAL

## 2018-08-05 MED ORDER — ENOXAPARIN SODIUM 40 MG/0.4ML ~~LOC~~ SOLN: 40.0000 mg | SUBCUTANEOUS | Status: DC

## 2018-08-05 MED ORDER — SENNOSIDES-DOCUSATE SODIUM 8.6-50 MG PO TABS
2.0000 | ORAL_TABLET | ORAL | Status: DC
  Administered 2018-08-05 – 2018-08-07 (×3): 2 via ORAL
  Filled 2018-08-05 (×3): qty 2

## 2018-08-05 MED ORDER — ACETAMINOPHEN 325 MG PO TABS
650.0000 mg | ORAL_TABLET | ORAL | Status: DC | PRN
  Administered 2018-08-06 – 2018-08-07 (×2): 650 mg via ORAL
  Filled 2018-08-05 (×2): qty 2

## 2018-08-05 MED ORDER — OXYTOCIN 10 UNIT/ML IJ SOLN
INTRAMUSCULAR | Status: AC
  Filled 2018-08-05: qty 4

## 2018-08-05 MED ORDER — ONDANSETRON HCL 4 MG/2ML IJ SOLN
INTRAMUSCULAR | Status: AC
  Filled 2018-08-05: qty 2

## 2018-08-05 MED ORDER — OXYTOCIN 40 UNITS IN LACTATED RINGERS INFUSION - SIMPLE MED: 2.5000 [IU]/h | INTRAVENOUS | Status: AC

## 2018-08-05 MED ORDER — SODIUM BICARBONATE 8.4 % IV SOLN
INTRAVENOUS | Status: AC
  Filled 2018-08-05: qty 50

## 2018-08-05 MED ORDER — COCONUT OIL OIL
1.0000 "application " | TOPICAL_OIL | Status: DC | PRN
  Administered 2018-08-06: 1 via TOPICAL
  Filled 2018-08-05: qty 120

## 2018-08-05 MED ORDER — ENOXAPARIN SODIUM 40 MG/0.4ML ~~LOC~~ SOLN
40.0000 mg | SUBCUTANEOUS | Status: DC
  Administered 2018-08-06 – 2018-08-08 (×3): 40 mg via SUBCUTANEOUS
  Filled 2018-08-05 (×3): qty 0.4

## 2018-08-05 MED ORDER — LACTATED RINGERS IV SOLN
INTRAVENOUS | Status: DC | PRN
  Administered 2018-08-05: 13:00:00 via INTRAVENOUS

## 2018-08-05 MED ORDER — LACTATED RINGERS IV SOLN
INTRAVENOUS | Status: DC | PRN
  Administered 2018-08-05 (×2): via INTRAVENOUS

## 2018-08-05 MED ORDER — OXYCODONE-ACETAMINOPHEN 5-325 MG PO TABS: 2.0000 | ORAL_TABLET | ORAL | Status: DC | PRN

## 2018-08-05 MED ORDER — TERBUTALINE SULFATE 1 MG/ML IJ SOLN: 0.2500 mg | Freq: Once | INTRAMUSCULAR | Status: DC | PRN

## 2018-08-05 MED ORDER — ZOLPIDEM TARTRATE 5 MG PO TABS: 5.0000 mg | ORAL_TABLET | Freq: Every evening | ORAL | Status: DC | PRN

## 2018-08-05 MED ORDER — LACTATED RINGERS IV SOLN: INTRAVENOUS | Status: DC

## 2018-08-05 MED ORDER — DEXAMETHASONE SODIUM PHOSPHATE 4 MG/ML IJ SOLN
INTRAMUSCULAR | Status: DC | PRN
  Administered 2018-08-05: 4 mg via INTRAVENOUS

## 2018-08-05 MED ORDER — WITCH HAZEL-GLYCERIN EX PADS: 1.0000 "application " | MEDICATED_PAD | CUTANEOUS | Status: DC | PRN

## 2018-08-05 MED ORDER — IBUPROFEN 800 MG PO TABS
800.0000 mg | ORAL_TABLET | Freq: Three times a day (TID) | ORAL | Status: DC
  Administered 2018-08-06 – 2018-08-08 (×5): 800 mg via ORAL
  Filled 2018-08-05 (×5): qty 1

## 2018-08-05 MED ORDER — PRENATAL MULTIVITAMIN CH
1.0000 | ORAL_TABLET | Freq: Every day | ORAL | Status: DC
  Administered 2018-08-06 – 2018-08-07 (×2): 1 via ORAL
  Filled 2018-08-05 (×2): qty 1

## 2018-08-05 MED ORDER — CEFAZOLIN SODIUM-DEXTROSE 2-4 GM/100ML-% IV SOLN
INTRAVENOUS | Status: AC
  Filled 2018-08-05: qty 100

## 2018-08-05 MED ORDER — FENTANYL CITRATE (PF) 100 MCG/2ML IJ SOLN: 25.0000 ug | INTRAMUSCULAR | Status: DC | PRN

## 2018-08-05 MED ORDER — DIPHENHYDRAMINE HCL 25 MG PO CAPS: 25.0000 mg | ORAL_CAPSULE | Freq: Four times a day (QID) | ORAL | Status: DC | PRN

## 2018-08-05 MED ORDER — MORPHINE SULFATE (PF) 0.5 MG/ML IJ SOLN
INTRAMUSCULAR | Status: AC
  Filled 2018-08-05: qty 10

## 2018-08-05 MED ORDER — SODIUM CHLORIDE 0.9 % IV SOLN
500.0000 mg | Freq: Once | INTRAVENOUS | Status: AC
  Administered 2018-08-05: 500 mg via INTRAVENOUS
  Filled 2018-08-05: qty 500

## 2018-08-05 MED ORDER — CEFAZOLIN SODIUM-DEXTROSE 2-3 GM-%(50ML) IV SOLR
INTRAVENOUS | Status: DC | PRN
  Administered 2018-08-05: 2 g via INTRAVENOUS

## 2018-08-05 MED ORDER — TETANUS-DIPHTH-ACELL PERTUSSIS 5-2.5-18.5 LF-MCG/0.5 IM SUSP: 0.5000 mL | Freq: Once | INTRAMUSCULAR | Status: DC

## 2018-08-05 MED ORDER — METOCLOPRAMIDE HCL 5 MG/ML IJ SOLN
INTRAMUSCULAR | Status: DC | PRN
  Administered 2018-08-05: 10 mg via INTRAVENOUS

## 2018-08-05 MED ORDER — LACTATED RINGERS AMNIOINFUSION
INTRAVENOUS | Status: DC
  Administered 2018-08-05: 11:00:00 via INTRAUTERINE

## 2018-08-05 MED ORDER — OXYCODONE-ACETAMINOPHEN 5-325 MG PO TABS: 1.0000 | ORAL_TABLET | ORAL | Status: DC | PRN

## 2018-08-05 MED ORDER — KETOROLAC TROMETHAMINE 30 MG/ML IJ SOLN
INTRAMUSCULAR | Status: DC | PRN
  Administered 2018-08-05: 30 mg via INTRAVENOUS

## 2018-08-05 MED ORDER — SIMETHICONE 80 MG PO CHEW
80.0000 mg | CHEWABLE_TABLET | ORAL | Status: DC
  Administered 2018-08-05 – 2018-08-07 (×3): 80 mg via ORAL
  Filled 2018-08-05 (×3): qty 1

## 2018-08-05 MED ORDER — DEXAMETHASONE SODIUM PHOSPHATE 4 MG/ML IJ SOLN
INTRAMUSCULAR | Status: AC
  Filled 2018-08-05: qty 1

## 2018-08-05 MED ORDER — METOCLOPRAMIDE HCL 5 MG/ML IJ SOLN
INTRAMUSCULAR | Status: AC
  Filled 2018-08-05: qty 2

## 2018-08-05 MED ORDER — MENTHOL 3 MG MT LOZG: 1.0000 | LOZENGE | OROMUCOSAL | Status: DC | PRN

## 2018-08-05 SURGICAL SUPPLY — 45 items
APL SKNCLS STERI-STRIP NONHPOA (GAUZE/BANDAGES/DRESSINGS) ×1
BENZOIN TINCTURE PRP APPL 2/3 (GAUZE/BANDAGES/DRESSINGS) ×3 IMPLANT
CELL SAVER LIPIGURD (MISCELLANEOUS) ×1 IMPLANT
CHLORAPREP W/TINT 26ML (MISCELLANEOUS) ×3 IMPLANT
CLAMP CORD UMBIL (MISCELLANEOUS) IMPLANT
CLOSURE STERI-STRIP 1/4X4 (GAUZE/BANDAGES/DRESSINGS) ×2 IMPLANT
CLOSURE WOUND 1/2 X4 (GAUZE/BANDAGES/DRESSINGS) ×1
CLOTH BEACON ORANGE TIMEOUT ST (SAFETY) ×3 IMPLANT
DEVICE RETRIEVAL ALEXIS 14 (MISCELLANEOUS) IMPLANT
DRAPE C SECTION CLR SCREEN (DRAPES) IMPLANT
DRSG OPSITE POSTOP 4X10 (GAUZE/BANDAGES/DRESSINGS) ×3 IMPLANT
ELECT REM PT RETURN 9FT ADLT (ELECTROSURGICAL) ×3
ELECTRODE REM PT RTRN 9FT ADLT (ELECTROSURGICAL) ×1 IMPLANT
EXTRACTOR VACUUM M CUP 4 TUBE (SUCTIONS) IMPLANT
EXTRACTOR VACUUM M CUP 4' TUBE (SUCTIONS)
EXTRT SYSTEM ALEXIS 14CM (MISCELLANEOUS) ×3
GLOVE BIO SURGEON STRL SZ7.5 (GLOVE) ×3 IMPLANT
GLOVE BIOGEL PI IND STRL 7.0 (GLOVE) ×1 IMPLANT
GLOVE BIOGEL PI INDICATOR 7.0 (GLOVE) ×2
GOWN STRL REUS W/TWL 2XL LVL3 (GOWN DISPOSABLE) ×3 IMPLANT
GOWN STRL REUS W/TWL LRG LVL3 (GOWN DISPOSABLE) ×6 IMPLANT
KIT ABG SYR 3ML LUER SLIP (SYRINGE) IMPLANT
NDL HYPO 25X5/8 SAFETYGLIDE (NEEDLE) IMPLANT
NEEDLE HYPO 22GX1.5 SAFETY (NEEDLE) ×3 IMPLANT
NEEDLE HYPO 25X5/8 SAFETYGLIDE (NEEDLE) IMPLANT
NS IRRIG 1000ML POUR BTL (IV SOLUTION) ×3 IMPLANT
PACK C SECTION WH (CUSTOM PROCEDURE TRAY) ×3 IMPLANT
PAD OB MATERNITY 4.3X12.25 (PERSONAL CARE ITEMS) ×3 IMPLANT
PENCIL SMOKE EVAC W/HOLSTER (ELECTROSURGICAL) ×3 IMPLANT
RTRCTR C-SECT PINK 25CM LRG (MISCELLANEOUS) ×3 IMPLANT
STRIP CLOSURE SKIN 1/2X4 (GAUZE/BANDAGES/DRESSINGS) ×2 IMPLANT
SUT CHROMIC 1 CTX 36 (SUTURE) ×6 IMPLANT
SUT VIC AB 0 CT1 36 (SUTURE) ×2 IMPLANT
SUT VIC AB 1 CT1 36 (SUTURE) ×6 IMPLANT
SUT VIC AB 2-0 CT1 (SUTURE) ×3 IMPLANT
SUT VIC AB 2-0 CT1 27 (SUTURE) ×3
SUT VIC AB 2-0 CT1 TAPERPNT 27 (SUTURE) ×1 IMPLANT
SUT VIC AB 3-0 CT1 27 (SUTURE) ×6
SUT VIC AB 3-0 CT1 TAPERPNT 27 (SUTURE) ×2 IMPLANT
SUT VIC AB 3-0 SH 27 (SUTURE)
SUT VIC AB 3-0 SH 27X BRD (SUTURE) IMPLANT
SUT VIC AB 4-0 KS 27 (SUTURE) ×3 IMPLANT
SYR BULB IRRIGATION 50ML (SYRINGE) IMPLANT
TOWEL OR 17X24 6PK STRL BLUE (TOWEL DISPOSABLE) ×3 IMPLANT
TRAY FOLEY W/BAG SLVR 14FR LF (SET/KITS/TRAYS/PACK) ×3 IMPLANT

## 2018-08-05 NOTE — Op Note (Signed)
Cesarean Section Procedure Note  08/05/2018  1:49 PM  PATIENT:  Haley Potter  22 y.o. female  PRE-OPERATIVE DIAGNOSIS:  IUP 41 1/7 weeks, Failure to progress and Non reassuring FHT's  POST-OPERATIVE DIAGNOSIS:  SAA, viable female infant  PROCEDURE:  Procedure(s): CESAREAN SECTION (N/A)  SURGEON:  Surgeon(s) and Role:    * Hermina StaggersErvin, Shelden Raborn L, MD - Primary  ASSISTANTS: none   ANESTHESIA:   epidural  EBL:  Total I/O In: 2000 [I.V.:2000] Out: 1350 [Urine:250; Blood:1100]  BLOOD ADMINISTERED:none  DRAINS: none   LOCAL MEDICATIONS USED:  MARCAINE     SPECIMEN:  Placenta  DISPOSITION OF SPECIMEN:  PATHOLOGY   Procedure Details   The patient was admitted on 08/04/18 for IOL d/t post dates. C section recommended due to above indications The risks, benefits, complications, treatment options, and expected outcomes were discussed with the patient.  The patient concurred with the proposed plan, giving informed consent.  The site of surgery properly noted/marked. The patient was taken to Operating Room # 1, identified as Haley Potter and the procedure verified as C-Section Delivery. A Time Out was held and the above information confirmed.  After induction of anesthesia, the patient was draped and prepped in the usual sterile manner. A Pfannenstiel incision was made and carried down through the subcutaneous tissue to the fascia. Fascial incision was made and extended transversely. The fascia was separated from the underlying rectus tissue superiorly and inferiorly. The peritoneum was identified and entered. Alexis retractor was placed. The utero-vesical peritoneal reflection was identified and bladder flap was created sharply. Bladder blade was positioned.  A low transverse uterine incision was made. A viable female infant was deliveries without problems. Good color and cry was noted. Cord was clamped and cut after 1 minute. Infant was passed off the NICU attendants. Apgars and weight  as recorded.  After the umbilical cord was clamped and cut cord blood was obtained for evaluation. The placenta was removed intact and appeared normal. The uterine outline, tubes and ovaries appeared normal. The uterine incision was closed with running locked sutures of Chromic in a two layer fashion.  Hemostasis was observed.  Bladder flap was closed with 3/0 Vicryl. Lavage was carried out until clear. The peritoneum and abd muscles were closed with 2/0 Vicryl. The fascia was then reapproximated with running sutures of 0 Vicryl. The skin was reapproximated with Vicryl. 30 cc Marcaine injected at incision. Honeycomb dressing applied.   Instrument, sponge, and needle counts were correct prior the abdominal closure and at the conclusion of the case.   Complications:  None; patient tolerated the procedure well.  COUNTS:  YES  PLAN OF CARE: Transfer to PACU and then Mother Baby for routine post op care  PATIENT DISPOSITION:  PACU - hemodynamically stable.   Delay start of Pharmacological VTE agent (>24hrs) due to surgical blood loss or risk of bleeding: not applicable             Disposition: PACU - hemodynamically stable.         Condition: stable   Hermina StaggersErvin, Jeily Guthridge L, MD 08/05/2018 1:49 PM

## 2018-08-05 NOTE — Transfer of Care (Signed)
Immediate Anesthesia Transfer of Care Note  Patient: Haley Potter  Procedure(s) Performed: CESAREAN SECTION (N/A Abdomen)  Patient Location: PACU  Anesthesia Type:Epidural  Level of Consciousness: awake, alert  and oriented  Airway & Oxygen Therapy: Patient Spontanous Breathing  Post-op Assessment: Report given to RN and Post -op Vital signs reviewed and stable  Post vital signs: Reviewed and stable  Last Vitals:  Vitals Value Taken Time  BP    Temp    Pulse 120 08/05/2018  1:58 PM  Resp    SpO2 96 % 08/05/2018  1:58 PM  Vitals shown include unvalidated device data.  Last Pain:  Vitals:   08/05/18 1233  TempSrc: Oral  PainSc: 0-No pain      Patients Stated Pain Goal: 0 (08/04/18 1952)  Complications: No apparent anesthesia complications

## 2018-08-05 NOTE — Progress Notes (Signed)
Discussed with Reita ClicheWeinhold, CNM EFM and interventions done by RNs. Discusses Pitocin to be turned off due to EFM tracing. CNM ordered Pitocin to be stopped. See MAR.

## 2018-08-05 NOTE — Progress Notes (Signed)
Mom pain controlled on epidural and comfortable.   FB has just fallen out and RN was placing foley cath, will do cervical check when done.  Mom has no requests/complaints, contractions every 3-5 min and FHM cat 1  -Dr. Parke SimmersBland

## 2018-08-05 NOTE — Progress Notes (Signed)
Haley Potter is a 22 y.o. G1P0000 at 4927w1d admitted for induction of labor due to Post dates.   Subjective: Resting comfortably with epidural in place  Objective: BP (!) 119/55   Pulse (!) 114   Temp 98.3 F (36.8 C) (Oral)   Resp 16   Ht 5\' 2"  (1.575 m)   Wt 86.3 kg   LMP 10/21/2017 (Exact Date)   SpO2 100%   BMI 34.79 kg/m  I/O last 3 completed shifts: In: -  Out: 500 [Urine:500] No intake/output data recorded.  FHT:  FHR: 155 bpm, variability: moderate with intermittent periods of minimal,  accelerations:  Present,  decelerations:  Absent UC:   irregular, every 4-7 minutes SVE:   Dilation: 6.5 Effacement (%): 70 Station: 0 Exam by:: Haley Potter, CNM  Labs: Lab Results  Component Value Date   WBC 11.9 (H) 08/04/2018   HGB 12.5 08/04/2018   HCT 36.4 08/04/2018   MCV 84.5 08/04/2018   PLT 175 08/04/2018    Assessment / Plan: Periods of minimal variability AROM now, clear fluid FSE and IUPC placed for improved assessment Amnioinfusion, Pitocin off due to minimal variability Reevaluate for Pitocin at 12pm Anticipated MOD:  NSVD   Dr. Alysia PennaErvin contacted for review fetal tracing and evaluate plan of care.  Haley Potter, CNM 08/05/2018, 10:30am

## 2018-08-05 NOTE — Anesthesia Postprocedure Evaluation (Signed)
Anesthesia Post Note  Patient: Haley Potter  Procedure(s) Performed: CESAREAN SECTION (N/A Abdomen)     Patient location during evaluation: Mother Baby Anesthesia Type: Epidural Level of consciousness: awake and alert Pain management: pain level controlled Vital Signs Assessment: post-procedure vital signs reviewed and stable Respiratory status: spontaneous breathing, nonlabored ventilation and respiratory function stable Cardiovascular status: stable Postop Assessment: no headache, no backache and epidural receding Anesthetic complications: no    Last Vitals:  Vitals:   08/05/18 1940 08/05/18 2029  BP:    Pulse:    Resp: 18   Temp:    SpO2:  97%    Last Pain:  Vitals:   08/05/18 1830  TempSrc: Oral  PainSc:    Pain Goal: Patients Stated Pain Goal: 0 (08/04/18 1952)               Marrion CoyMERRITT,[name]

## 2018-08-05 NOTE — Progress Notes (Signed)
Patient ID: Haley Potter, female   DOB: 09/04/1996, 22 y.o.   MRN: [id number] CTSP d/t minimal variability and no cervical progress. Pt was admitted on 08/04/18 for IOL d/t post dates. Pt has received cytotec and foley bulb. AROM clear fluids. Pitocin augmentation. Pitocin off/on d/t FHT No cervical change for over 2 hrs  Discussed no cervical changes and FHT's with pt. C section recommended to pt. R/B/Post op care reviewed with pt Pt verbalized understanding and agrees to proceed. Nursing and anesthesia aware. Pre op antibiotics. Bicitra and SCD's ordered.

## 2018-08-06 DIAGNOSIS — Z3A41 41 weeks gestation of pregnancy: Secondary | ICD-10-CM

## 2018-08-06 LAB — CBC
HEMATOCRIT: 27.2 % — AB (ref 36.0–46.0)
Hemoglobin: 9.2 g/dL — ABNORMAL LOW (ref 12.0–15.0)
MCH: 28.8 pg (ref 26.0–34.0)
MCHC: 33.8 g/dL (ref 30.0–36.0)
MCV: 85 fL (ref 78.0–100.0)
Platelets: 165 10*3/uL (ref 150–400)
RBC: 3.2 MIL/uL — ABNORMAL LOW (ref 3.87–5.11)
RDW: 15.1 % (ref 11.5–15.5)
WBC: 21.8 10*3/uL — ABNORMAL HIGH (ref 4.0–10.5)

## 2018-08-06 MED ORDER — DIPHENHYDRAMINE HCL 25 MG PO CAPS: 25.0000 mg | ORAL_CAPSULE | ORAL | Status: DC | PRN

## 2018-08-06 MED ORDER — NALOXONE HCL 4 MG/10ML IJ SOLN
1.0000 ug/kg/h | INTRAVENOUS | Status: DC | PRN
  Filled 2018-08-06: qty 5

## 2018-08-06 MED ORDER — FERROUS SULFATE 325 (65 FE) MG PO TABS
325.0000 mg | ORAL_TABLET | Freq: Two times a day (BID) | ORAL | Status: DC
  Administered 2018-08-06 – 2018-08-08 (×4): 325 mg via ORAL
  Filled 2018-08-06 (×4): qty 1

## 2018-08-06 MED ORDER — SODIUM CHLORIDE 0.9 % IV SOLN
INTRAVENOUS | Status: DC | PRN
  Administered 2018-08-06: 10 mL/h via INTRAVENOUS
  Administered 2018-08-07 – 2018-08-08 (×3): 250 mL via INTRAVENOUS

## 2018-08-06 MED ORDER — DIPHENHYDRAMINE HCL 50 MG/ML IJ SOLN: 12.5000 mg | INTRAMUSCULAR | Status: DC | PRN

## 2018-08-06 MED ORDER — NALBUPHINE HCL 10 MG/ML IJ SOLN: 5.0000 mg | INTRAMUSCULAR | Status: DC | PRN

## 2018-08-06 MED ORDER — NALBUPHINE HCL 10 MG/ML IJ SOLN: 5.0000 mg | Freq: Once | INTRAMUSCULAR | Status: DC | PRN

## 2018-08-06 MED ORDER — SCOPOLAMINE 1 MG/3DAYS TD PT72
1.0000 | MEDICATED_PATCH | Freq: Once | TRANSDERMAL | Status: DC
  Filled 2018-08-06: qty 1

## 2018-08-06 NOTE — Progress Notes (Signed)
Dr Parke SimmersBland notified that pt's temperature 101. Order to give tylenol

## 2018-08-06 NOTE — Clinical Social Work Maternal (Signed)
CLINICAL SOCIAL WORK MATERNAL/CHILD NOTE  Patient Details  Name: Haley Potter MRN: [id number] Date of Birth: 08/05/2016  Date:  08/06/2018  Clinical Social Worker Initiating Note:  Galen Malkowski Boyd-Gilyard Date/Time: Initiated:  08/06/18/1430     Child's Name:  Haley Potter   Biological Parents:  Mother(FOB is Haley Potter 08/09/1995)   Need for Interpreter:  None   Reason for Referral:  Behavioral Health Concerns, Current Substance Use/Substance Use During Pregnancy    Address:  [address] [place] [address]    Phone number:  [phone] (home)     Additional phone number:   Household Members/Support Persons (HM/SP):   (Per MOB, MOB lives iwth MOB's mother and older sister. )   HM/SP Name Relationship DOB or Age  HM/SP -1        HM/SP -2        HM/SP -3        HM/SP -4        HM/SP -5        HM/SP -6        HM/SP -7        HM/SP -8          Natural Supports (not living in the home):  Extended Family, Spouse/significant other, Immediate Family, Friends   Professional Supports: None   Employment: Unemployed   Type of Work:     Education:  High school graduate   Homebound arranged:    Financial Resources:  Medicaid   Other Resources:  WIC   Cultural/Religious Considerations Which May Impact Care:  None reported  Strengths:  Ability to meet basic needs , Home prepared for child , Psychotropic Medications, Understanding of illness   Psychotropic Medications:         Pediatrician:       Pediatrician List:   [place]    [place]    [place]    [place]    [place]    [place]      Pediatrician Fax Number:    Risk Factors/Current Problems:  Mental Health Concerns , Substance Use    Cognitive State:  Alert , Able to Concentrate , Linear Thinking    Mood/Affect:  Interested , Happy , Bright , Comfortable    CSW Assessment: Acknowledged order for Social Work consult to assess mother's history of  depression and marijuana use.   Mother was receptive to social work intervention.  Mother reports hx of depression since age 18 however is not currently engaged in any type of treatment. Mother reported no signs or symptoms in over a year.   Mother denies any other hx of psychiatric treatment.  Mother states that she was referred for outpatient services but didn't follow through because she felt much better.  She denies any SI, HI, or current symptoms of depression or anxiety.   Discussed signs/symptoms of PP depression with mother.  Provided her with literature and treatment resources if needed.  Mother admitted to use of marijuana during pregnancy. Mother shared that she was not aware that she was pregnant until Apr 17, 2018; last reported use 04/17/18. Informed her of the hospital's drug screen policy.  CSW made mother aware that CSW will monitor UDS and CDS and will make a report to [place] CPS if warranted.   Mother declined all resources and reported having all essential items need for parenting.   CSW Plan/Description:  No Further Intervention Required/No Barriers to Discharge, Sudden Infant Death Syndrome (SIDS) Education, Perinatal Mood   and Anxiety Disorder (PMADs) Education, Other Patient/Family Education, Other Information/Referral to Community Resources, CSW Will Continue to Monitor Umbilical Cord Tissue Drug Screen Results and Make Report if Warranted, Hospital Drug Screen Policy Information   Asia Dusenbury Boyd-Gilyard, MSW, LCSW Clinical Social Work [phone]   Doreen Garretson D BOYD-GILYARD, LCSW 08/06/2018, 2:33 PM  

## 2018-08-06 NOTE — Progress Notes (Signed)
POSTPARTUM PROGRESS NOTE  POD #1  Subjective:  Haley Potter is a 22 y.o. G1P1001 s/p pLTCS at 3754w1d.  She reports she doing well. No acute events overnight. She reports she is doing well. She denies any problems with ambulating, voiding or po intake. Denies nausea or vomiting. She has passed flatus. Pain is well controlled.  Lochia is appropriate.  Objective: Blood pressure 104/62, pulse 91, temperature 97.9 F (36.6 C), temperature source Oral, resp. rate 18, height 5\' 2"  (1.575 m), weight 86.3 kg, last menstrual period 10/21/2017, SpO2 97 %, unknown if currently breastfeeding.  Physical Exam:  General: alert, cooperative and no distress Chest: no respiratory distress Heart:regular rate, distal pulses intact Abdomen: soft, nontender,  Uterine Fundus: firm, appropriately tender DVT Evaluation: No calf swelling or tenderness Extremities: No LE edema Skin: warm, dry; incision clean/dry/intact w/ honeycomb dressing in place  Recent Labs    08/05/18 1724 08/06/18 0516  HGB 11.1* 9.2*  HCT 32.6* 27.2*    Assessment/Plan: Haley Potter is a 22 y.o. G1P1001 s/p pLTCS at 4954w1d for minimal fetal variability and failure to progress.  POD#1 - Doing welll; pain well controlled. H/H appropriate  Routine postpartum care  OOB, ambulated  Lovenox for VTE prophylaxis Anemia: asymptomatic  -Start po ferrous sulfate BID Contraception: Mirena  Feeding: Breast   Dispo: Plan for discharge tomorrow.   LOS: 2 days   Marcy Sirenatherine Anothy Bufano, D.O. OB Fellow  08/06/2018, 2:21 PM

## 2018-08-06 NOTE — Lactation Note (Signed)
This note was copied from a baby's chart. Lactation Consultation Note  Patient Name: Haley Potter MVHQI'OToday's Date: 08/06/2018 Reason for consult: Initial assessment;1st time breastfeeding;Primapara;Term Breastfeeding consultation services and support information given and reviewed.  Baby is 25 hours old and latching well per mom.  Baby is currently on the breast in football hold.  Baby is feeding actively.  Mom states she does hand expression prior to feeding.  Instructed to feed with cues.  Discussed cluster feeding.  Encouraged to call out for assist/concerns prn.  Maternal Data Has patient been taught Hand Expression?: Yes Does the patient have breastfeeding experience prior to this delivery?: No  Feeding Feeding Type: Breast Fed  LATCH Score Latch: Grasps breast easily, tongue down, lips flanged, rhythmical sucking.  Audible Swallowing: A few with stimulation  Type of Nipple: Everted at rest and after stimulation  Comfort (Breast/Nipple): Soft / non-tender  Hold (Positioning): No assistance needed to correctly position infant at breast.  LATCH Score: 9  Interventions Interventions: Breast feeding basics reviewed  Lactation Tools Discussed/Used     Consult Status Consult Status: Follow-up Date: 08/07/18 Follow-up type: In-patient    Huston FoleyMOULDEN, Arcenio Mullaly S 08/06/2018, 2:33 PM

## 2018-08-06 NOTE — Progress Notes (Signed)
Notified Dr. Parke SimmersBland of temperature recheck at 2344 of 102.5. MDs to decide plan.

## 2018-08-06 NOTE — Lactation Note (Signed)
This note was copied from a baby's chart. Lactation Consultation Note  Patient Name: Haley Potter ZOXWR'UToday's Date: 08/06/2018 Reason for consult: Follow-up assessment;Difficult latch;Term;Primapara;1st time breastfeeding  P1 mother whose infant is now 4028 hours old.  RN requested latch assistance.  RN concerned about baby's sucking ability and requested assistance.  Baby did not have a coordinated suck on my gloved finger.  She wanted to curl her tongue upward and thrust her tongue forward.  Suck training performed but baby not very interested since she had recently taken 8 mls of formula.  She remained sleepy.  She also required cheek and jaw support to maintain a suck.  Since she had just recently fed I suggested mother begin pumping with the DEBP.  Mother in agreement with this plan.  Initiated the DEBP.  Reviewed pump parts, assembly, disassembly and cleaning.  Mother demonstrated hand expression and was able to express a couple of drops of colostrum which was finger fed back to baby.  Encouraged mother to feed 8-12 times/24 hours or sooner if baby showed cues.  Suggested mother ask for latch assistance at next feeding.    Infant started to awaken while I was still in the room so I attempted to help latch in the football position on the right breast.  Baby was agitated but did latch with flanged lips.  However, she only sucked for about 2 minutes and then became agitated again.  She began having gas and burping.  Released her from the breast and burped well.  Attempted to latch again but she was too sleepy.  Showed father how to change a diaper and baby had a very large green stool with a void.  Demonstrated effective swaddling and baby calmed easily.  Mother was pumping when I left the room and will feed back any EBM she obtains with pumping.  She will also do hand expression before/after feedings.  RN updated.   Maternal Data Formula Feeding for Exclusion: No Has patient been taught Hand  Expression?: Yes Does the patient have breastfeeding experience prior to this delivery?: No  Feeding Feeding Type: Breast Fed Length of feed: 2 min  LATCH Score Latch: Repeated attempts needed to sustain latch, nipple held in mouth throughout feeding, stimulation needed to elicit sucking reflex.  Audible Swallowing: None  Type of Nipple: Everted at rest and after stimulation  Comfort (Breast/Nipple): Soft / non-tender  Hold (Positioning): Assistance needed to correctly position infant at breast and maintain latch.  LATCH Score: 6  Interventions Interventions: Breast feeding basics reviewed;Assisted with latch;Skin to skin;Breast massage;Hand express;Position options;Support pillows;Adjust position;Breast compression;DEBP  Lactation Tools Discussed/Used Pump Review: Setup, frequency, and cleaning;Milk Storage Initiated by:: Haley Potter Date initiated:: 08/06/18   Consult Status Consult Status: Follow-up Date: 08/07/18 Follow-up type: In-patient    Haley Potter 08/06/2018, 5:15 PM

## 2018-08-06 NOTE — Progress Notes (Signed)
Paged about temp 101.  HR 107, bp wnl.  AM hgb 9.2 down from baseline ~12.  Patient complained about being cold when room was set to lower temp and she wasn't using blanket.  She was given blankets and heat turned on in room, then complained of being warm w/ temp checked 101.  Tylenol ordered and Dr. Parke SimmersBland to room for evaluate.  Patient comfortable but tired appearing.  We discussed that infection was in differential and patient was insistent she did not feel sick and was just tired from feeding baby at night.  She has been ambulating around room with no difficulties and no significant belly pain.  Op note without any charted complications.  We discussed a recheck of vitals in an hour.  -Dr. Parke SimmersBland

## 2018-08-07 ENCOUNTER — Encounter (HOSPITAL_COMMUNITY): Payer: Self-pay

## 2018-08-07 LAB — CBC
HCT: 29.3 % — ABNORMAL LOW (ref 36.0–46.0)
Hemoglobin: 9.8 g/dL — ABNORMAL LOW (ref 12.0–15.0)
MCH: 28.6 pg (ref 26.0–34.0)
MCHC: 33.4 g/dL (ref 30.0–36.0)
MCV: 85.4 fL (ref 78.0–100.0)
Platelets: 171 10*3/uL (ref 150–400)
RBC: 3.43 MIL/uL — ABNORMAL LOW (ref 3.87–5.11)
RDW: 15.4 % (ref 11.5–15.5)
WBC: 14.4 10*3/uL — ABNORMAL HIGH (ref 4.0–10.5)

## 2018-08-07 MED ORDER — ONDANSETRON HCL 4 MG/2ML IJ SOLN: 4.0000 mg | Freq: Three times a day (TID) | INTRAMUSCULAR | Status: DC | PRN

## 2018-08-07 MED ORDER — KETOROLAC TROMETHAMINE 30 MG/ML IJ SOLN: 30.0000 mg | Freq: Four times a day (QID) | INTRAMUSCULAR | Status: DC | PRN

## 2018-08-07 MED ORDER — ACETAMINOPHEN 500 MG PO TABS: 1000.0000 mg | ORAL_TABLET | Freq: Four times a day (QID) | ORAL | Status: DC

## 2018-08-07 MED ORDER — CLINDAMYCIN PHOSPHATE 900 MG/50ML IV SOLN
900.0000 mg | Freq: Three times a day (TID) | INTRAVENOUS | Status: DC
  Administered 2018-08-07 – 2018-08-08 (×4): 900 mg via INTRAVENOUS
  Filled 2018-08-07 (×5): qty 50

## 2018-08-07 MED ORDER — NALOXONE HCL 0.4 MG/ML IJ SOLN: 0.4000 mg | INTRAMUSCULAR | Status: DC | PRN

## 2018-08-07 MED ORDER — LACTATED RINGERS IV BOLUS
1000.0000 mL | Freq: Once | INTRAVENOUS | Status: AC
  Administered 2018-08-07: 1000 mL via INTRAVENOUS

## 2018-08-07 MED ORDER — SODIUM CHLORIDE 0.9% FLUSH: 3.0000 mL | INTRAVENOUS | Status: DC | PRN

## 2018-08-07 MED ORDER — GENTAMICIN SULFATE 40 MG/ML IJ SOLN
5.0000 mg/kg | INTRAVENOUS | Status: DC
  Administered 2018-08-07: 430 mg via INTRAVENOUS
  Filled 2018-08-07 (×2): qty 10.75

## 2018-08-07 NOTE — Progress Notes (Signed)
Significant Event Note  Subjective: Called to patient's room regarding temperature of 102.5. Patient in bathroom, partner with infant in room as well. Patient returned to bed and states her only complaint is being hot and tired. She denies pain with urination. Denies back pain. States incision is only somewhat uncomfortable. Hasn't noticed any discharge from incision. Dressing has not been changed. States bleeding is minimal. Denies shortness of breath, chest pain. Denies pain in legs. States newborn has been cluster feeding, she is also giving some formula. Denies any breast pain except with latch.   Objective:  Vitals:   08/06/18 2226 08/06/18 2344  BP: 118/69   Pulse: (!) 107   Resp: 18   Temp: (!) 101 F (38.3 C) (!) 102.5 F (39.2 C)  SpO2: 99%   Gen: tired-appearing, moving around room without difficulty Cardio: Tachycardic, no murmurs Pulm: CTAB, no crackles or wheezing Abd/GU: normal BS, uterus firm and below level of umbilicus  honeycomb dressing in place, C/D/I aside from one small area parked in middle of dressing without spread  no surrounding skin erythema Ext: pulses in tact  1+ pitting edema bilaterally   Assessment/Plan: Pauletta is a 22yo G1P1001 POD#2 from primary LTCS for NRFHTs. She was initially admitted for postdates induction at 41w0. Induction started with FB, cytotec. Transitioned to pitocin but minimal variability. Given no cervical change and FHT tracing, decision made for C-section. No complications with C-section, patient was doing well POD#1 until developing a fever this evening. DDx includes endometritis, mastitis, less likely clot given lovenox prophylaxis and no symptoms, normal O2 %. No other localizing symptoms for infection.  -- start broad-spectrum antibiotics with gent/clinda x 48 hours -- LR 1L bolus now -- repeat CBC -- continue Tylenol prn   Laurel S. Earlene PlaterWallace, DO OB/GYN Fellow

## 2018-08-07 NOTE — Lactation Note (Signed)
This note was copied from a baby's chart. Lactation Consultation Note  Patient Name: Haley Potter Today's Date: 08/07/2018   Based on the previous notes from the nursing/NP staff, I asked Marylene Landngela, RN what the mom's feeding intentions were. After discussing with the patient, the RN relayed back to me that Mom did not want a lactation visit & was only going to formula feed.  Lurline HareRichey, Deontra Pereyra [facility] 08/07/2018, 4:52 PM

## 2018-08-07 NOTE — Progress Notes (Signed)
Subjective: Postpartum Day #2: Cesarean Delivery Patient reports no problems voiding. Seen last PM for T 102.5 and was started on Gent/Clinda for most likely UTI vs endometritis. Pt states she feels well, no complaints nor clear source of infection. Ambulating and voiding without difficulty. Breastfeeding and bottlefeeding; desires Mirena for contraception.  Objective: Vital signs in last 24 hours: Temp:  [97.9 F (36.6 C)-102.5 F (39.2 C)] 99.6 F (37.6 C) (08/24 [id number]) Pulse Rate:  [85-129] 129 (08/24 0638) Resp:  [16-24] 24 (08/24 [id number]) BP: (104-144)/(62-71) 144/71 (08/24 [id number]) SpO2:  [96 %-99 %] 99 % (08/24 [id number])  Physical Exam:  General: fatigued and no distress Lochia: appropriate Uterine Fundus: firm Incision: honeycomb intact, dry; abd soft/NT DVT Evaluation: No evidence of DVT seen on physical exam.  Recent Labs    08/06/18 0516 08/07/18 0012  HGB 9.2* 9.8*  HCT 27.2* 29.3*    Assessment/Plan: Status post Cesarean section. Postoperative course complicated by fever last PM of 102.5 - presumed endometritis vs UTI Continue current care of Gent/Clinda x 24-48 hrs. Potentially d/c home tomorrow 8/25 if remains afebrile.  Cam HaiSHAW, Trestin Vences CNM 08/07/2018, 7:37 AM

## 2018-08-07 NOTE — Lactation Note (Signed)
This note was copied from a baby's chart. Lactation Consultation Note  Patient Name: Girl Lysle PearlKyla Barnett ZOXWR'UToday's Date: 08/07/2018 Reason for consult: Follow-up assessment;Difficult latch;Term P1, 38 hour infant, weight loss -4% Per mom, try latching infant to breast at 6 pm today. Per mom,  nipples are sore and she is very tired, so infant's  last  2 feedings was  formula (20 mls.) in a slow flow bottle nipple. Infant had two stools (green) while LC was in the room that MGM changed . LC observed mom has flat nipples and infant has been latching to nipple tip, mom has bli.INFster on her left nipple. Infant attempted to latch but mom report pain/ pinching with and without use of  NS. Mom plans to hand express and pump to let nipples heal and then continue work towards infant latching to breast. Infant would not sustain latch and Mom reported pain even after infant had wide mouth gape and swallowing her LC had mom break latch and re-latch but did not continue after 3 attempts due mom expressing pain w/ latch. Discussed sore nipple treatment: mom put EMB on nipples, use comfort gels and coconut oil to help with soreness. Mom continue work toward infant latching to breast w/out pain.  Per mom, tried pumping  one time but stopped nothing came out [facility] explain pumping will  help with breast stimulation and induction. LC encouraged mom to continue pumping or try hand expression to help with milk supply. LC assisted mom in hand expression and mom expressed 3 mls of BM that was given to infant on a spoon. MGM gave infant 20 mls of formula to supplement w/ BF. Mom will try pump every 3 q hours. LC discussed engorgement treatment and prevention. Mom made aware of O/P services, breastfeeding support groups, community resources, and our phone # for post-discharge questions.  Maternal Data    Feeding Feeding Type: Breast Fed Nipple Type: Slow - flow  LATCH Score Latch: Repeated attempts needed to sustain  latch, nipple held in mouth throughout feeding, stimulation needed to elicit sucking reflex.  Audible Swallowing: A few with stimulation  Type of Nipple: Flat  Comfort (Breast/Nipple): Filling, red/small blisters or bruises, mild/mod discomfort  Hold (Positioning): Full assist, staff holds infant at breast(Mom has blister on nipple tip)  LATCH Score: 4  Interventions Interventions: Assisted with latch;Hand express;Coconut oil;Comfort gels;DEBP;Position options;Expressed milk;Support pillows  Lactation Tools Discussed/Used Tools: Comfort gels Nipple shield size: 20   Consult Status Consult Status: Follow-up Date: 08/07/18 Follow-up type: In-patient    Danelle EarthlyRobin Yuta Cipollone 08/07/2018, 3:08 AM

## 2018-08-08 ENCOUNTER — Other Ambulatory Visit: Payer: Self-pay

## 2018-08-08 MED ORDER — OXYCODONE-ACETAMINOPHEN 5-325 MG PO TABS: 2.0000 | ORAL_TABLET | ORAL | 0 refills | Status: DC | PRN

## 2018-08-08 MED ORDER — CLINDAMYCIN HCL 300 MG PO CAPS
450.0000 mg | ORAL_CAPSULE | Freq: Three times a day (TID) | ORAL | Status: DC
  Administered 2018-08-08: 450 mg via ORAL
  Filled 2018-08-08 (×2): qty 1

## 2018-08-08 MED ORDER — AMOXICILLIN-POT CLAVULANATE 875-125 MG PO TABS
1.0000 | ORAL_TABLET | Freq: Two times a day (BID) | ORAL | Status: AC
  Administered 2018-08-08 (×2): 1 via ORAL
  Filled 2018-08-08 (×2): qty 1

## 2018-08-08 NOTE — Discharge Summary (Signed)
OB Discharge Summary     Patient Name: Haley Potter DOB: 05/22/1996 MRN: [id number]  Date of admission: 08/04/2018 Delivering MD: Hermina StaggersERVIN, MICHAEL L   Date of discharge: 08/08/2018  Admitting diagnosis: INDUCTION Intrauterine pregnancy: 1233w1d     Secondary diagnosis:  Active Problems:   Indication for care in labor and delivery, antepartum  Additional problems: pos partum fever     Discharge diagnosis: Term Pregnancy Delivered                                                                                                Post partum procedures:none  Augmentation: Pitocin and Cytotec  Complications: PLTCS for NRFHT, post partum fever  Hospital course:  Induction of Labor With Cesarean Section  22 y.o. yo G1P1001 at 5733w1d was admitted to the hospital 08/04/2018 for induction of labor. Patient had a labor course significant for NRFHT. The patient went for cesarean section due to Non-Reassuring FHR, and delivered a Viable infant,08/05/2018  Membrane Rupture Time/Date: 9:30 AM ,08/05/2018   Details of operation can be found in separate operative Note.  Patient had an uncomplicated postpartum course. She is ambulating, tolerating a regular diet, passing flatus, and urinating well.  Patient is discharged home in stable condition on 08/08/18.                                   Pt was found to have a fever on PPD#1. Treated with gent/clinda. DDx: UTI vs. Endometritis. No fever for~36 hours at the time of discharge. No abx given on discharge.   Physical exam  Vitals:   08/07/18 [id number] 08/07/18 1331 08/07/18 2121 08/08/18 0633  BP: (!) 144/71 102/64 117/75 119/74  Pulse: (!) 129 76 71 71  Resp: (!) 24 16 18 18   Temp: 99.6 F (37.6 C) 98.3 F (36.8 C) 97.8 F (36.6 C) 98.8 F (37.1 C)  TempSrc: Oral Oral Oral   SpO2: 99%   96%  Weight:      Height:       General: alert, cooperative and no distress Lochia: appropriate Uterine Fundus: firm Incision: Healing well with no significant  drainage DVT Evaluation: No evidence of DVT seen on physical exam. Labs: Lab Results  Component Value Date   WBC 14.4 (H) 08/07/2018   HGB 9.8 (L) 08/07/2018   HCT 29.3 (L) 08/07/2018   MCV 85.4 08/07/2018   PLT 171 08/07/2018   CMP Latest Ref Rng & Units 08/05/2018  Creatinine 0.44 - 1.00 mg/dL 1.190.55    Discharge instruction: per After Visit Summary and "Baby and Me Booklet".  After visit meds:  Allergies as of 08/08/2018   No Known Allergies     Medication List    TAKE these medications   acetaminophen 500 MG tablet Commonly known as:  TYLENOL Take 500 mg by mouth every 6 (six) hours as needed for mild pain or headache.   oxyCODONE-acetaminophen 5-325 MG tablet Commonly known as:  PERCOCET/ROXICET Take 2 tablets by mouth every 4 (four) hours as needed (pain scale >  7).   prenatal multivitamin Tabs tablet Take 1 tablet by mouth daily at 12 noon.       Diet: routine diet  Activity: Advance as tolerated. Pelvic rest for 6 weeks.   Outpatient follow up:2 weeks Follow up Appt:No future appointments. Follow up Visit:No follow-ups on file.  Postpartum contraception: IUD Mirena  Newborn Data: Live born female  Birth Weight: 6 lb 13.7 oz (3110 g) APGAR: 8, 9  Newborn Delivery   Birth date/time:  08/05/2018 13:08:00 Delivery type:  C-Section, Low Transverse Trial of labor:  Yes C-section categorization:  Primary     Baby Feeding: Bottle and Breast Disposition:home with mother   08/08/2018 Mirian Mo, MD

## 2018-08-08 NOTE — Discharge Instructions (Signed)

## 2018-08-08 NOTE — Progress Notes (Addendum)
When starting clindamycin iv infusion, iv site became infiltrated and IV was taken out.  Notified Dr Parke SimmersBland (on call) to request order for clindamycin and gentamycin be changed to PO.  Waiting for new orders to be placed by physician.

## 2019-06-29 DIAGNOSIS — H5213 Myopia, bilateral: Secondary | ICD-10-CM | POA: Diagnosis not present

## 2019-10-07 IMAGING — US US MFM FETAL BPP W/O NON-STRESS
1 series · 16 of 26 positions shown · non-contrast
Comparison: none

[Series 1: us mfm fetal bpp w/o non-stress · 26 acquisitions, 16 frames shown]
[im 1/26]
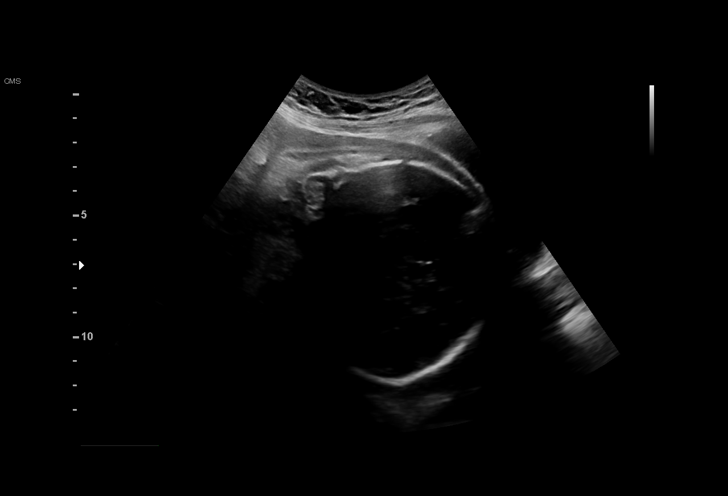
[im 3/26]
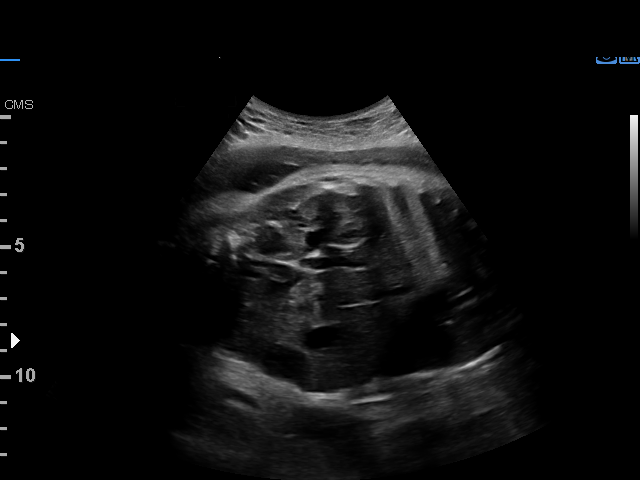
[im 5/26]
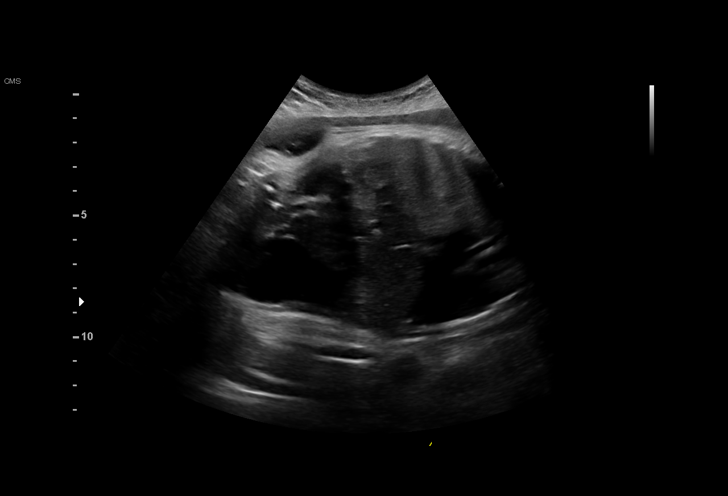
[im 6/26]
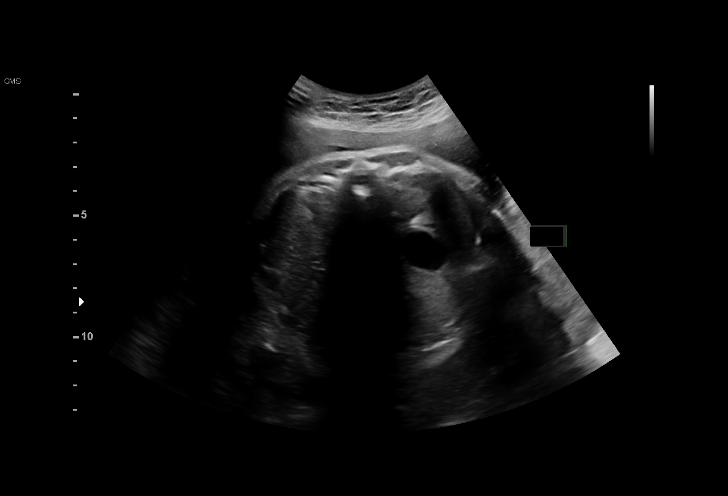
[im 8/26]
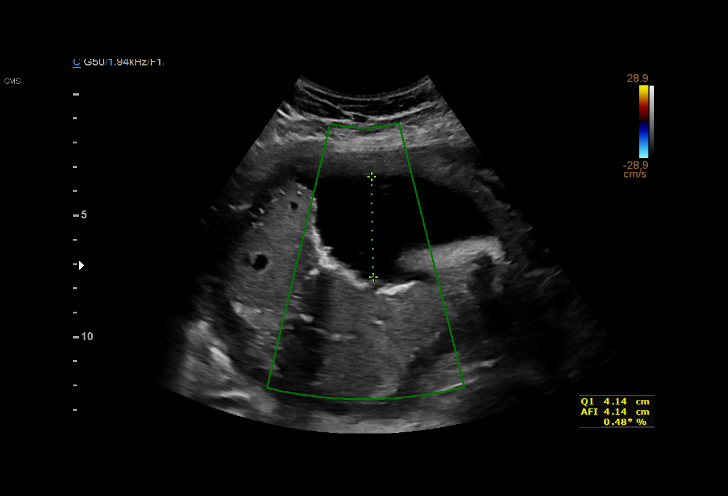
[im 9/26]
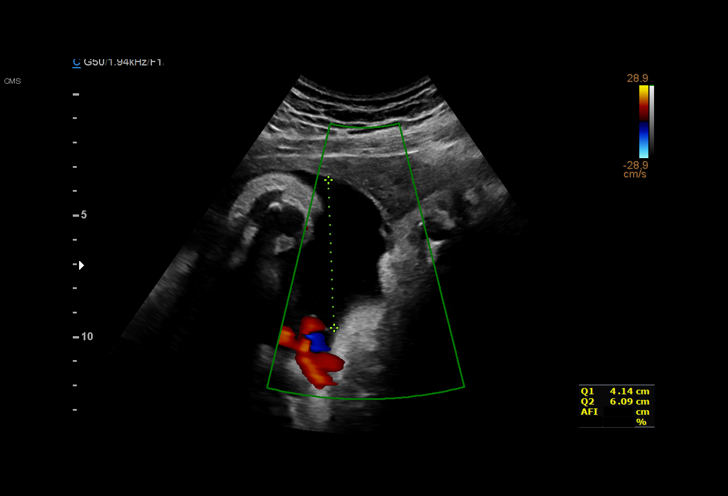
[im 11/26]
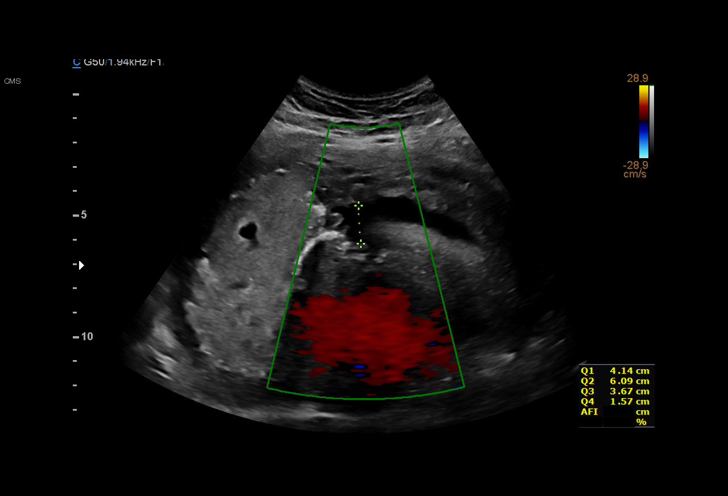
[im 13/26]
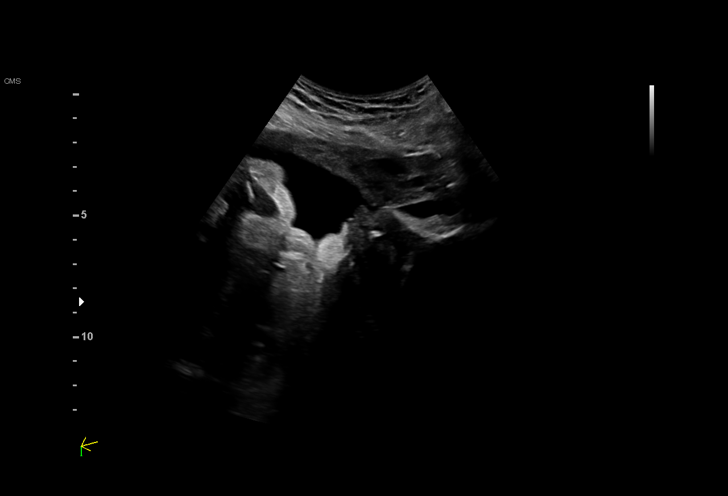
[im 14/26]
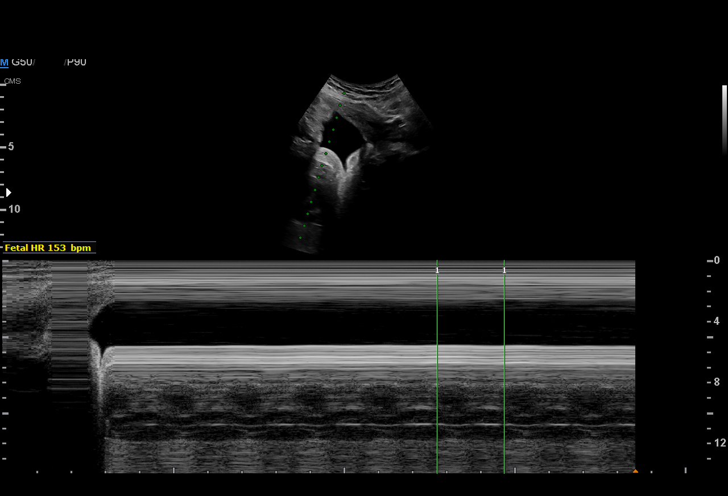
[im 16/26]
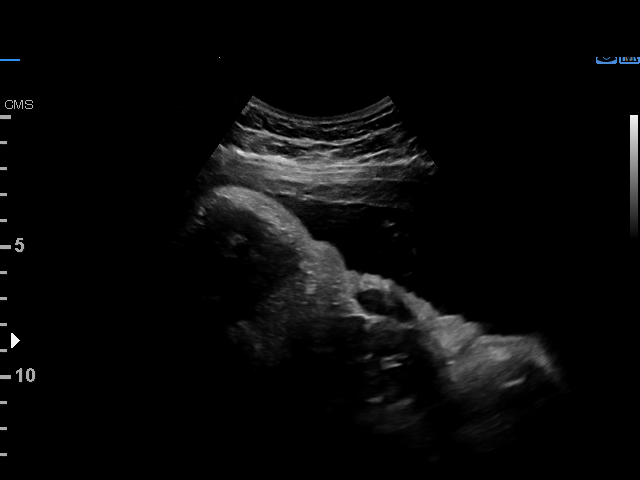
[im 18/26]
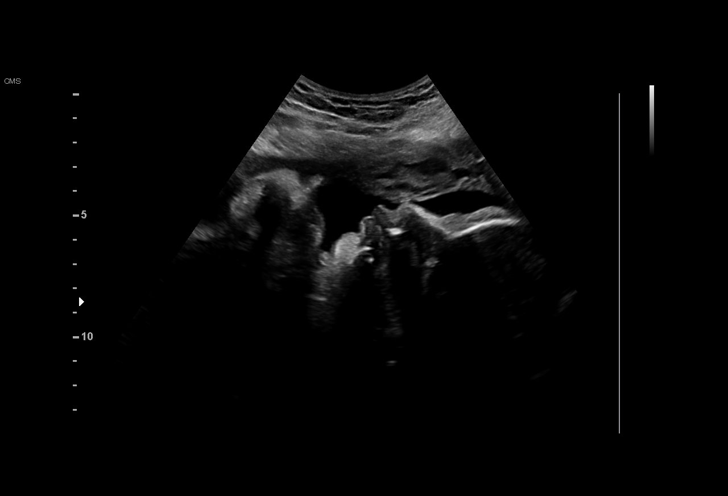
[im 19/26]
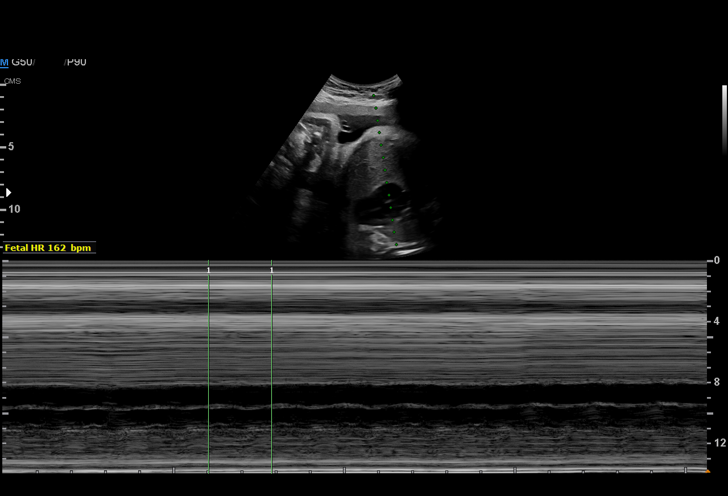
[im 21/26]
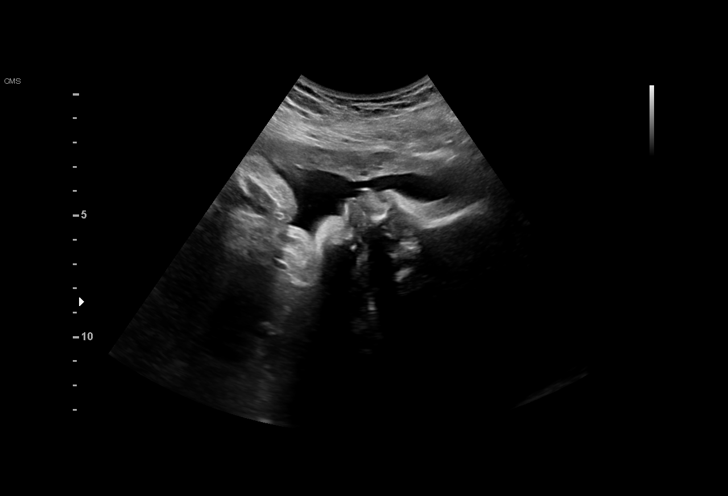
[im 22/26]
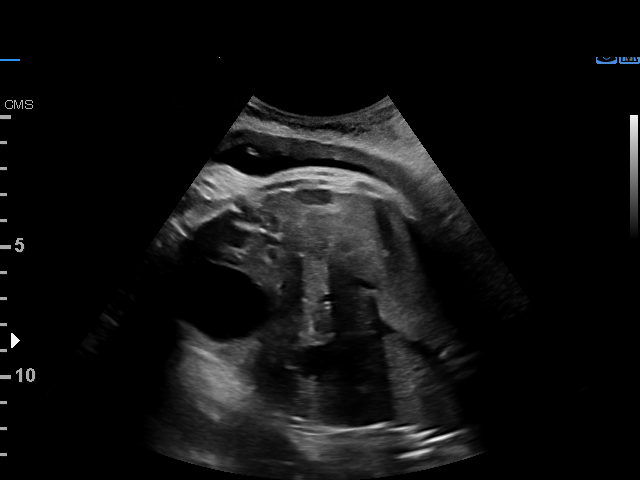
[im 24/26]
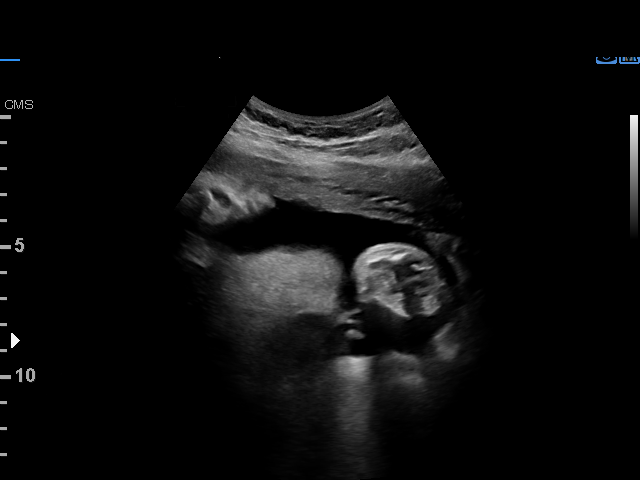
[im 26/26]
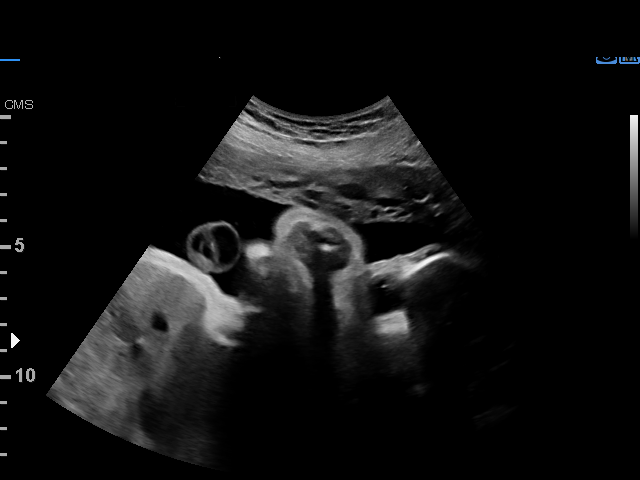

[16 of 26 positions shown; findings below may reference images not displayed]

1  MILAN MICKO FELICIS           [id number]      [id number]     [id number]
Indications

36 weeks gestation of pregnancy
Non-reactive NST
OB History

Gravidity:    1         Term:   0        Prem:   0        SAB:   0
TOP:          0       Ectopic:  0        Living: 0
Fetal Evaluation

Num Of Fetuses:     1
Fetal Heart         153
Rate(bpm):
Cardiac Activity:   Observed
Presentation:       Cephalic

Amniotic Fluid
AFI FV:      Subjectively within normal limits

AFI Sum(cm)     %Tile       Largest Pocket(cm)
15.47           57

RUQ(cm)       RLQ(cm)       LUQ(cm)        LLQ(cm)
4.14
Biophysical Evaluation

Amniotic F.V:   Within normal limits       F. Tone:        Observed
F. Movement:    Observed                   Score:          [DATE]
F. Breathing:   Observed
Gestational Age

LMP:           36w 2d        Date:  10/21/17                 EDD:   07/28/18
Best:          36w 2d     Det. By:  LMP  (10/21/17)          EDD:   07/28/18
Recommendations

Follow up as clinically indicated and or scheduled.

## 2019-12-01 DIAGNOSIS — Z20828 Contact with and (suspected) exposure to other viral communicable diseases: Secondary | ICD-10-CM | POA: Diagnosis not present

## 2020-06-20 DIAGNOSIS — Z3009 Encounter for other general counseling and advice on contraception: Secondary | ICD-10-CM | POA: Diagnosis not present

## 2020-06-20 DIAGNOSIS — Z113 Encounter for screening for infections with a predominantly sexual mode of transmission: Secondary | ICD-10-CM | POA: Diagnosis not present

## 2020-06-20 DIAGNOSIS — B373 Candidiasis of vulva and vagina: Secondary | ICD-10-CM | POA: Diagnosis not present

## 2020-06-20 DIAGNOSIS — Z01419 Encounter for gynecological examination (general) (routine) without abnormal findings: Secondary | ICD-10-CM | POA: Diagnosis not present

## 2020-07-02 ENCOUNTER — Encounter: Payer: Self-pay | Admitting: Student

## 2020-07-11 ENCOUNTER — Encounter: Payer: Medicaid Other | Admitting: Student

## 2020-10-15 DIAGNOSIS — F411 Generalized anxiety disorder: Secondary | ICD-10-CM | POA: Diagnosis not present

## 2020-11-13 DIAGNOSIS — F4310 Post-traumatic stress disorder, unspecified: Secondary | ICD-10-CM | POA: Diagnosis not present

## 2020-11-27 DIAGNOSIS — F4310 Post-traumatic stress disorder, unspecified: Secondary | ICD-10-CM | POA: Diagnosis not present

## 2020-12-03 DIAGNOSIS — Z20822 Contact with and (suspected) exposure to covid-19: Secondary | ICD-10-CM | POA: Diagnosis not present

## 2021-03-06 DIAGNOSIS — F4310 Post-traumatic stress disorder, unspecified: Secondary | ICD-10-CM | POA: Diagnosis not present

## 2021-03-13 DIAGNOSIS — F4310 Post-traumatic stress disorder, unspecified: Secondary | ICD-10-CM | POA: Diagnosis not present

## 2021-03-21 DIAGNOSIS — F4310 Post-traumatic stress disorder, unspecified: Secondary | ICD-10-CM | POA: Diagnosis not present

## 2021-03-28 DIAGNOSIS — F4310 Post-traumatic stress disorder, unspecified: Secondary | ICD-10-CM | POA: Diagnosis not present

## 2021-04-04 DIAGNOSIS — F4310 Post-traumatic stress disorder, unspecified: Secondary | ICD-10-CM | POA: Diagnosis not present

## 2021-06-18 DIAGNOSIS — N920 Excessive and frequent menstruation with regular cycle: Secondary | ICD-10-CM | POA: Diagnosis not present

## 2021-06-18 DIAGNOSIS — R102 Pelvic and perineal pain: Secondary | ICD-10-CM | POA: Diagnosis not present

## 2021-06-18 DIAGNOSIS — R4586 Emotional lability: Secondary | ICD-10-CM | POA: Diagnosis not present

## 2021-06-18 DIAGNOSIS — Z113 Encounter for screening for infections with a predominantly sexual mode of transmission: Secondary | ICD-10-CM | POA: Diagnosis not present

## 2021-06-18 DIAGNOSIS — G43909 Migraine, unspecified, not intractable, without status migrainosus: Secondary | ICD-10-CM | POA: Diagnosis not present

## 2021-06-18 DIAGNOSIS — Z7689 Persons encountering health services in other specified circumstances: Secondary | ICD-10-CM | POA: Diagnosis not present

## 2021-06-18 DIAGNOSIS — Z0000 Encounter for general adult medical examination without abnormal findings: Secondary | ICD-10-CM | POA: Diagnosis not present

## 2021-07-11 DIAGNOSIS — N920 Excessive and frequent menstruation with regular cycle: Secondary | ICD-10-CM | POA: Diagnosis not present

## 2021-07-11 DIAGNOSIS — Z3202 Encounter for pregnancy test, result negative: Secondary | ICD-10-CM | POA: Diagnosis not present

## 2021-07-11 DIAGNOSIS — Z124 Encounter for screening for malignant neoplasm of cervix: Secondary | ICD-10-CM | POA: Diagnosis not present

## 2021-07-11 DIAGNOSIS — R102 Pelvic and perineal pain: Secondary | ICD-10-CM | POA: Diagnosis not present

## 2021-07-11 DIAGNOSIS — Z30011 Encounter for initial prescription of contraceptive pills: Secondary | ICD-10-CM | POA: Diagnosis not present

## 2021-10-30 DIAGNOSIS — N750 Cyst of Bartholin's gland: Secondary | ICD-10-CM | POA: Diagnosis not present

## 2021-11-05 DIAGNOSIS — B952 Enterococcus as the cause of diseases classified elsewhere: Secondary | ICD-10-CM | POA: Diagnosis not present

## 2021-11-05 DIAGNOSIS — N750 Cyst of Bartholin's gland: Secondary | ICD-10-CM | POA: Diagnosis not present

## 2021-11-14 DIAGNOSIS — N750 Cyst of Bartholin's gland: Secondary | ICD-10-CM | POA: Diagnosis not present

## 2023-12-07 ENCOUNTER — Other Ambulatory Visit (HOSPITAL_COMMUNITY)
Admission: RE | Admit: 2023-12-07 | Discharge: 2023-12-07 | Disposition: A | Discharge status: Home / Self Care | Payer: Medicaid Other | Source: Ambulatory Visit | Attending: Family | Admitting: Family

## 2023-12-07 ENCOUNTER — Ambulatory Visit: Payer: Medicaid Other | Admitting: Family

## 2023-12-07 VITALS — BP 110/76 | HR 109 | Temp 97.7°F | Ht 62.0 in | Wt 131.8 lb

## 2023-12-07 DIAGNOSIS — M25512 Pain in left shoulder: Secondary | ICD-10-CM

## 2023-12-07 DIAGNOSIS — R197 Diarrhea, unspecified: Secondary | ICD-10-CM | POA: Diagnosis not present

## 2023-12-07 DIAGNOSIS — Z113 Encounter for screening for infections with a predominantly sexual mode of transmission: Secondary | ICD-10-CM | POA: Insufficient documentation

## 2023-12-07 DIAGNOSIS — Z7689 Persons encountering health services in other specified circumstances: Secondary | ICD-10-CM | POA: Diagnosis not present

## 2023-12-07 DIAGNOSIS — M549 Dorsalgia, unspecified: Secondary | ICD-10-CM

## 2023-12-07 DIAGNOSIS — Z124 Encounter for screening for malignant neoplasm of cervix: Secondary | ICD-10-CM | POA: Diagnosis present

## 2023-12-07 DIAGNOSIS — G8929 Other chronic pain: Secondary | ICD-10-CM

## 2023-12-07 DIAGNOSIS — M25511 Pain in right shoulder: Secondary | ICD-10-CM

## 2023-12-07 DIAGNOSIS — Z114 Encounter for screening for human immunodeficiency virus [HIV]: Secondary | ICD-10-CM

## 2023-12-07 MED ORDER — GABAPENTIN 300 MG PO CAPS: 300.0000 mg | ORAL_CAPSULE | Freq: Every day | ORAL | 1 refills | Status: DC

## 2023-12-07 MED ORDER — MELOXICAM 7.5 MG PO TABS: 7.5000 mg | ORAL_TABLET | Freq: Every day | ORAL | 1 refills | Status: DC

## 2023-12-07 NOTE — Progress Notes (Signed)
Subjective:    Haley Potter - 27 y.o. female MRN [id number]  Date of birth: 1996/11/19  HPI  Haley Potter is to establish care.  Current issues and/or concerns: - Pap smear.  - Back pain for 1 year. States lower back pain worse than upper back. States bilateral shoulders pain/numbness (right shoulder only) and left arm pain more recently. Denies recent trauma/injury and red flag symptoms. States over-the-counter medications do not help. - States every time she eats her stomach gets "tore up" from diarrhea. Denies red flag symptoms.  - Patient plans to return at later date for annual physical exam.   ROS per HPI     Health Maintenance:  Health Maintenance Due  Topic Date Due   HPV VACCINES (1 - 3-dose series) Never done   Hepatitis C Screening  Never done   DTaP/Tdap/Td (1 - Tdap) Never done   Cervical Cancer Screening (Pap smear)  Never done   INFLUENZA VACCINE  Never done   COVID-19 Vaccine (1 - 2024-25 season) Never done     Past Medical History: Patient Active Problem List   Diagnosis Date Noted   Indication for care in labor and delivery, antepartum 08/04/2018   Abnormal vaginal fluids 07/02/2018      Social History   reports that she has quit smoking. She has never used smokeless tobacco. She reports that she does not currently use drugs after having used the following drugs: Marijuana. She reports that she does not drink alcohol.   Family History  family history includes Cardiomyopathy in her father; Fibromyalgia in her mother; Heart attack in her maternal grandfather; Lupus in her mother.   Medications: reviewed and updated   Objective:   Physical Exam BP 110/76   Pulse (!) 109   Temp 97.7 F (36.5 C) (Oral)   Ht 5\' 2"  (1.575 m)   Wt 131 lb 12.8 oz (59.8 kg)   LMP 11/21/2023 (Exact Date)   SpO2 97%   BMI 24.11 kg/m   Physical Exam HENT:     Head: Normocephalic and atraumatic.     Nose: Nose normal.     Mouth/Throat:     Mouth: Mucous  membranes are moist.     Pharynx: Oropharynx is clear.  Eyes:     Extraocular Movements: Extraocular movements intact.     Conjunctiva/sclera: Conjunctivae normal.     Pupils: Pupils are equal, round, and reactive to light.  Cardiovascular:     Rate and Rhythm: Tachycardia present.     Pulses: Normal pulses.     Heart sounds: Normal heart sounds.  Pulmonary:     Effort: Pulmonary effort is normal.     Breath sounds: Normal breath sounds.  Genitourinary:    General: Normal vulva.     Vagina: Normal.     Cervix: Normal.     Uterus: Normal.      Adnexa: Right adnexa normal and left adnexa normal.     Comments: Curley Spice, CMA present.  Musculoskeletal:        General: Normal range of motion.     Right shoulder: Normal.     Left shoulder: Normal.     Right upper arm: Normal.     Left upper arm: Normal.     Right elbow: Normal.     Left elbow: Normal.     Right forearm: Normal.     Left forearm: Normal.     Right wrist: Normal.     Left wrist: Normal.  Right hand: Normal.     Left hand: Normal.     Cervical back: Normal, normal range of motion and neck supple.     Thoracic back: Normal.     Lumbar back: Normal.     Right hip: Normal.     Left hip: Normal.     Right upper leg: Normal.     Left upper leg: Normal.     Right knee: Normal.     Left knee: Normal.     Right lower leg: Normal.     Left lower leg: Normal.     Right ankle: Normal.     Left ankle: Normal.     Right foot: Normal.     Left foot: Normal.  Neurological:     General: No focal deficit present.     Mental Status: She is alert and oriented to person, place, and time.  Psychiatric:        Mood and Affect: Mood normal.        Behavior: Behavior normal.        Assessment & Plan:  1. Encounter to establish care (Primary) - Patient presents today to establish care. During the interim follow-up with primary provider as scheduled.  - Return for annual physical examination, labs, and health  maintenance. Arrive fasting meaning having no food for at least 8 hours prior to appointment. You may have only water or black coffee. Please take scheduled medications as normal.  2. Pap smear for cervical cancer screening - Routine screening.  - Cytology - PAP([place])  3. Routine screening for STI (sexually transmitted infection) - Routine screening.  - Cervicovaginal ancillary only  4. Encounter for screening for HIV - Routine screening.  - HIV antibody (with reflex)  5. Chronic back pain, unspecified back location, unspecified back pain laterality 6. Bilateral shoulder pain, unspecified chronicity - Meloxicam and Gabapentin as prescribed. Counseled on medication adherence/adverse effects.  - Referral to Orthopedic Surgery for evaluation/management.  - Follow-up with primary provider as scheduled.  - meloxicam (MOBIC) 7.5 MG tablet; Take 1 tablet (7.5 mg total) by mouth daily.  Dispense: 30 tablet; Refill: 1 - gabapentin (NEURONTIN) 300 MG capsule; Take 1 capsule (300 mg total) by mouth at bedtime.  Dispense: 30 capsule; Refill: 1 - Ambulatory referral to Orthopedic Surgery  7. Diarrhea, unspecified type - Referral to Gastroenterology for evaluation/management. - Ambulatory referral to Gastroenterology    Patient was given clear instructions to go to Emergency Department or return to medical center if symptoms don't improve, worsen, or new problems develop.The patient verbalized understanding.  I discussed the assessment and treatment plan with the patient. The patient was provided an opportunity to ask questions and all were answered. The patient agreed with the plan and demonstrated an understanding of the instructions.   The patient was advised to call back or seek an in-person evaluation if the symptoms worsen or if the condition fails to improve as anticipated.    Ricky Stabs, NP 12/07/2023, 3:58 PM Primary Care at [facility]

## 2023-12-07 NOTE — Progress Notes (Signed)
Patient asking for Pap smear to be done.   Patient has multiple complaints to discuss with you.

## 2023-12-08 LAB — CYTOLOGY - PAP: Diagnosis: NEGATIVE

## 2023-12-08 LAB — HIV ANTIBODY (ROUTINE TESTING W REFLEX): HIV Screen 4th Generation wRfx: NONREACTIVE

## 2023-12-08 LAB — CERVICOVAGINAL ANCILLARY ONLY
Bacterial Vaginitis (gardnerella): POSITIVE — AB
Candida Glabrata: NEGATIVE
Candida Vaginitis: NEGATIVE
Chlamydia: NEGATIVE
Comment: NEGATIVE
Comment: NEGATIVE
Comment: NEGATIVE
Comment: NEGATIVE
Comment: NEGATIVE
Comment: NORMAL
Neisseria Gonorrhea: NEGATIVE
Trichomonas: NEGATIVE

## 2023-12-14 ENCOUNTER — Other Ambulatory Visit: Payer: Self-pay | Admitting: Family

## 2023-12-14 DIAGNOSIS — B9689 Other specified bacterial agents as the cause of diseases classified elsewhere: Secondary | ICD-10-CM

## 2023-12-14 MED ORDER — METRONIDAZOLE 500 MG PO TABS: 500.0000 mg | ORAL_TABLET | Freq: Two times a day (BID) | ORAL | 0 refills | Status: AC

## 2023-12-17 ENCOUNTER — Ambulatory Visit: Payer: Medicaid Other | Admitting: Physical Medicine and Rehabilitation

## 2023-12-17 ENCOUNTER — Encounter: Payer: Self-pay | Admitting: Physical Medicine and Rehabilitation

## 2023-12-17 DIAGNOSIS — M546 Pain in thoracic spine: Secondary | ICD-10-CM | POA: Diagnosis not present

## 2023-12-17 DIAGNOSIS — M7918 Myalgia, other site: Secondary | ICD-10-CM | POA: Diagnosis not present

## 2023-12-17 DIAGNOSIS — M545 Low back pain, unspecified: Secondary | ICD-10-CM

## 2023-12-17 DIAGNOSIS — G8929 Other chronic pain: Secondary | ICD-10-CM

## 2023-12-17 DIAGNOSIS — R202 Paresthesia of skin: Secondary | ICD-10-CM

## 2023-12-17 NOTE — Progress Notes (Signed)
 Functional Pain Scale - descriptive words and definitions  Distressing (6)    Pain is present/unable to complete most ADLs limited by pain/sleep is difficult and active distraction is only marginal. Moderate range order  Average Pain 6  Entire spine hurts. Mid back is the worst. She has pain on the right side between shoulder blades. She has pain and numbness, this has been happening a year. She has pain on the left that goes down her left arm to her middle finger all the way to her jaw, and into neck. She states that lower back pain is always the but manageable. Nothing seems to help the mid to upper back pain.
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
## 2024-01-15 ENCOUNTER — Ambulatory Visit: Payer: Medicaid Other | Admitting: Family

## 2024-01-27 ENCOUNTER — Other Ambulatory Visit: Payer: Self-pay | Admitting: Physical Medicine and Rehabilitation

## 2024-01-27 DIAGNOSIS — M25511 Pain in right shoulder: Secondary | ICD-10-CM

## 2024-01-27 DIAGNOSIS — G8929 Other chronic pain: Secondary | ICD-10-CM

## 2024-01-27 MED ORDER — MELOXICAM 7.5 MG PO TABS: 7.5000 mg | ORAL_TABLET | Freq: Every day | ORAL | 1 refills | Status: AC

## 2024-01-27 MED ORDER — GABAPENTIN 300 MG PO CAPS: 300.0000 mg | ORAL_CAPSULE | Freq: Every day | ORAL | 1 refills | Status: AC

## 2024-03-07 ENCOUNTER — Encounter: Payer: Medicaid Other | Admitting: Family

## 2024-04-15 ENCOUNTER — Other Ambulatory Visit: Payer: Self-pay | Admitting: Family

## 2024-04-15 ENCOUNTER — Encounter: Payer: Self-pay | Admitting: Family

## 2024-04-15 DIAGNOSIS — G8929 Other chronic pain: Secondary | ICD-10-CM

## 2024-04-15 DIAGNOSIS — M25511 Pain in right shoulder: Secondary | ICD-10-CM

## 2024-04-15 NOTE — Telephone Encounter (Signed)
 Patient established with [facility] (last appointment 12/17/2023 with Megan Williams, NP) for management of Meloxicam  and Gabapentin . Request refills from the same.

## 2024-04-15 NOTE — Telephone Encounter (Signed)
 Copied from CRM [id number]. Topic: Clinical - Medication Refill >> Apr 15, 2024  9:39 AM Oddis Bench wrote: Most Recent Primary Care Visit:  Provider: Lavona Pounds J  Department: [facility]  Visit Type: NEW PATIENT  Date: 12/07/2023  Medication: meloxicam  (MOBIC ) 7.5 MG tablet and gabapentin  (NEURONTIN ) 300 MG capsule    Has the patient contacted their pharmacy? No Pharmacy advises patients to contact there doctor first   Is this the correct pharmacy for this prescription? Yes If no, delete pharmacy and type the correct one.  This is the patient's preferred pharmacy:  [facility] [address], [place] - [address] [place] [address] Phone: [phone] Fax: [phone]   Has the prescription been filled recently? No  Is the patient out of the medication? Yes  Has the patient been seen for an appointment in the last year OR does the patient have an upcoming appointment? Yes  Can we respond through MyChart? Yes  Agent: Please be advised that Rx refills may take up to 3 business days. We ask that you follow-up with your pharmacy. Patient has appointment scheduled for 06/02 but she is going to need medications until that visit.

## 2024-04-18 NOTE — Telephone Encounter (Signed)
 Requested medication (s) are due for refill today: yes  Requested medication (s) are on the active medication list: yes  Last refill:  01/27/24  Future visit scheduled: yes  Notes to clinic:  Unable to refill per protocol, last refill by another provider.      Requested Prescriptions  Pending Prescriptions Disp Refills   gabapentin  (NEURONTIN ) 300 MG capsule 30 capsule 1    Sig: Take 1 capsule (300 mg total) by mouth at bedtime.     Neurology: Anticonvulsants - gabapentin  Failed - 04/18/2024  3:45 PM      Failed - Cr in normal range and within 360 days    Creatinine, Ser  Date Value Ref Range Status  08/05/2018 0.55 0.44 - 1.00 mg/dL Final         Passed - Completed PHQ-2 or PHQ-9 in the last 360 days      Passed - Valid encounter within last 12 months    Recent Outpatient Visits           4 months ago Encounter to establish care   [facility], [place], NP       Future Appointments             In 4 weeks Senaida Dama, NP [facility]             meloxicam  (MOBIC ) 7.5 MG tablet 30 tablet 1    Sig: Take 1 tablet (7.5 mg total) by mouth daily.     Analgesics:  COX2 Inhibitors Failed - 04/18/2024  3:45 PM      Failed - Manual Review: Labs are only required if the patient has taken medication for more than 8 weeks.      Failed - HGB in normal range and within 360 days    Hemoglobin  Date Value Ref Range Status  08/07/2018 9.8 (L) 12.0 - 15.0 g/dL Final         Failed - Cr in normal range and within 360 days    Creatinine, Ser  Date Value Ref Range Status  08/05/2018 0.55 0.44 - 1.00 mg/dL Final         Failed - HCT in normal range and within 360 days    HCT  Date Value Ref Range Status  08/07/2018 29.3 (L) 36.0 - 46.0 % Final         Failed - AST in normal range and within 360 days    No results found for: "POCAST", "AST"       Failed - ALT in normal range and within 360 days    No  results found for: "ALT", "LABALT", "POCALT"       Failed - eGFR is 30 or above and within 360 days    GFR calc Af Amer  Date Value Ref Range Status  08/05/2018 >60 >60 mL/min Final    Comment:    (NOTE) The eGFR has been calculated using the CKD EPI equation. This calculation has not been validated in all clinical situations. eGFR's persistently <60 mL/min signify possible Chronic Kidney Disease. Performed at [facility], [address], [place], [place] [address]    GFR calc non Af Zara Heymann  Date Value Ref Range Status  08/05/2018 >60 >60 mL/min Final         Passed - Patient is not pregnant      Passed - Valid encounter within last 12 months    Recent Outpatient  Visits           4 months ago Encounter to establish care   [facility], [place], NP       Future Appointments             In 4 weeks Senaida Dama, NP [facility]

## 2024-04-18 NOTE — Telephone Encounter (Signed)
 Patient established with [facility] (last appointment 12/17/2023 with Megan Williams, NP) for management of Meloxicam  and Gabapentin . Request refills from the same.

## 2024-04-19 NOTE — Telephone Encounter (Signed)
 I called patient with recommendation about medication refill however no one answered so I left a voicemail to return my call.  Patient established with [facility] (last appointment 12/17/2023 with Megan Williams, NP) for management of Meloxicam  and Gabapentin . Request refills from the same.

## 2024-04-19 NOTE — Telephone Encounter (Signed)
 Noted.

## 2024-04-20 NOTE — Telephone Encounter (Signed)
Patient aware of provider advice.  

## 2024-05-16 ENCOUNTER — Encounter: Admitting: Family

## 2024-05-16 NOTE — Progress Notes (Signed)
 Erroneous encounter-disregard

## 2024-07-20 ENCOUNTER — Encounter: Admitting: Family

## 2024-10-17 ENCOUNTER — Encounter: Payer: Self-pay | Admitting: Radiology
